# Patient Record
Sex: Female | Born: 2005 | Marital: Single
Health system: Southern US, Community
[De-identification: ages and names within clinical notes are randomized; demographics above are authoritative.]

---
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
## 2024-01-10 ENCOUNTER — Ambulatory Visit: Payer: MEDICAID | Admitting: Clinical

## 2024-01-10 DIAGNOSIS — F419 Anxiety disorder, unspecified: Secondary | ICD-10-CM

## 2024-01-10 DIAGNOSIS — F840 Autistic disorder: Secondary | ICD-10-CM | POA: Diagnosis not present

## 2024-01-10 NOTE — Progress Notes (Signed)
 Time: 8:01 am-8:55 am Diagnosis: F84.0, F41.9 CPT: 09162E-04  Cristina Long was seen remotely using secure video conferencing. She was in her home in [place] , and the therapist was in her office at the time of the appointment. Client is aware of risks of telehealth and consented to a virtual visit. Cristina Long and her mother reported upon two instances of being flexible, one in which Cristina Long had chosen a different routine, and another in which a plan had to be  changed due to circumstances beyond her control. Therapist discussed these situations with her, and engaged her in review of coping strategies, followed by review and discussion of social skills. Cristina Long is scheduled to be seen again in two weeks.   Treatment Plan Client Abilities/Strengths  Cristina Long presents as sociable and upbeat once she is engaged with an activity.  Client Treatment Preferences  Cristina Long is most engaged during morning appointments.  Client Statement of Needs  Cristina Long's mother is seeking CBT therapy to help her manage feelings of anxiety, especially related to changes in routine and time, and develop emotion regulation strategies  Treatment Level  Monthly  Symptoms  Anxiety: resistance to transition, feelings of worry, difficulty relaxing, tearfulness, angry outbursts including shouting and refusal to participate in undesired activities (Status: maintained).  Problems Addressed  New Description  Goals 1. Cristina Long's mother is seeking CBT therapy to help her manage anxiety and develop emotion regulation strategies Objective Cristina Long will be provided opportunities to process her experiences in social interactions and look for opportunities for social connection Target Date: 2026-011-1 Frequency: Biweekly  Progress: 60% Modality: individual  Related Interventions Therapist will provide Markeesha an opportunity to reflect upon her experiences in social interactions Therapist will engage Shontavia in review and discussion of social skills Therapist  will engage Cristina Long in discussion of communication strategies for when she is feeling stressed or would like to leave a social situation  Objective Cristina Long will develop strategies to regulate her emotions and will experience an overall decrease in anxiety Target Date: 2024-07-08 Frequency: Monthly  Progress: 70% Modality: individual   Objective Cristina Long will increase her time management skills Target date: 07/08/2024 Progress: 55% Modality: Individual Therapy Frequency: Biweekly  Objective  Cristina Long increase overall self awareness Target: 07/08/2024 Progress: 55% Modality: Individual Therapy Frequency: Biweekly   Related Interventions Therapist will engage Cristina Long's parents in treatment to help them create a routine and home environment that supports her goals Therapist will help Cristina Long to identify and disengage from maladaptive thought patterns using CBT-based strategies Therapist will provide referrals to additional resources as appropriate Therapist will incorporate video modeling to promote social skills as appropriate, including use of the [employer] and other resources Therapist will explore with Cristina Long how she can connect with others around her interests, as well as learn about the interests of others Cristina Long will develop strategies to help her regulate her emotions, including breathing exercises and self-care Therapist will provide Cristina Long with an opportunity to process her experiences in session Diagnosis Axis none 299.00 (Autistic disorder, current or active state) - Open - [Signifier: n/a]    Axis none 300.00 (Anxiety state, unspecified) - Open - [Signifier: n/a]    Conditions For Discharge Achievement of treatment goals and objectives   Intake Presenting Problem Cristina Long and her mother were seen remotely using the [employer] system. They are seeking CBT to help Cristina Long manage emotion regulation difficulties and anxiety related to a series of anticipated  transitions. Symptoms social impairment, self injury, rigid adherence to schedule and routine,  anxiety at unexpected changes History of Problem  Peter was diagnosed with ASD by the present provider in January of 2020 (please see evaluation on record). Since that time, her family has been able to access a range of services that have been helpful for her, including intensive in-home ABA therapy. However, Cristina Long has struggled with the abrupt end to some of her services, resulting in the end of relationships with beloved therapists  Recent Trigger  Cristina Long's mother is seeking CBT to help her manage anxiety and emotion regulation struggles related to anticipated transitions in therapy.  Marital and Family Information  Present family concerns/problems: none noted.  Strengths/resources in the family/friends: Cristina Long's mother presented as supportive of her development.  Marital/sexual history patterns: none reported.  Family of Origin  Problems in family of origin: None reported.  Family background / ethnic factors: None reported.  No needs/concerns related to ethnicity reported when asked: No  Education/Vocation  Interpersonal concerns/problems: Cristina Long was observed to struggle with social interactions, and appeared to function at about a 78 or 18 year-old level. This was consistent with results from an IQ test administered as part of her 2019 evaluation. She continues to struggle with difficulty with transition and rigidity regarding time.  Personal strengths: Cristina Long presented as pleasant and sociable Military/work problems/concerns: None noted.  Leisure Activities/Daily Functioning  impaired functioning  Legal Status  No Legal Problems  Medical/Nutritional Concerns  unspecified  Comments: Cristina Long has a history of health problems documented in her 2019 evaluation.  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  Motor Activity: tic, mannerism  Stream of Thought - Productivity: spontaneous  Stream of thought - Progression: perseveration  Stream of thought - Language: repetitive  Emotional tone and reactions - Mood: labile  Emotional tone and reactions - Affect: juvenile  Mental trend/Content of thoughts - Perception: normal  Mental trend/Content of thoughts - Orientation: normal  Mental trend/Content of thoughts - Memory: normal  Mental trend/Content of thoughts - General knowledge: concrete  Insight: minimal  Judgment: poor  Mental Status Comment:WNL  Diagnostic Summary  F84.0 (autism spectrum disorder), F41.9 Anxiety Disorder NOS   Intake Presenting Problem Cristina Long and her mother were seen remotely using the [employer] system. They are seeking CBT to help Cristina Long manage emotion regulation difficulties and anxiety related to a series of anticipated transitions. Symptoms social impairment, self injury, rigid adherence to schedule and routine, anxiety at unexpected changes History of Problem  Cristina Long was diagnosed with ASD by the present provider in January of 2020 (please see evaluation on record). Since that time, her family has been able to access a range of services that have been helpful for her, including intensive in-home ABA therapy. Recent Trigger  Liset's mother is seeking CBT to help her manage anxiety and emotion regulation struggles related to anticipated transitions in therapy.  Marital and Family Information  Present family concerns/problems: none noted.  Strengths/resources in the family/friends: Clarinda's mother presented as supportive of her development.  Marital/sexual history patterns: none reported.  Family of Origin  Problems in family of origin: None reported.  Family background / ethnic factors: None reported.  No needs/concerns related to ethnicity  reported when asked: No  Education/Vocation  Interpersonal concerns/problems: Timothy continues to struggle with difficulty with transition and rigidity regarding time. She also struggles  with reciprocal conversation. Personal strengths: Jayleigh is typically pleasant and motivated in therapy.  Military/work problems/concerns: None noted.  Leisure Activities/Daily Functioning  impaired functioning  Legal Status  No Legal Problems  Medical/Nutritional Concerns  unspecified  Comments: Brighton has a history of health problems documented in her 2019 evaluation.  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  Motor Activity: tic, mannerism  Stream of Thought - Productivity: spontaneous  Stream of thought - Progression: perseveration  Stream of thought - Language: repetitive  Emotional tone and reactions - Mood: labile  Emotional tone and reactions - Affect: WNL Mental trend/Content of thoughts - Perception: normal  Mental trend/Content of thoughts - Orientation: normal  Mental trend/Content of thoughts - Memory: normal  Mental trend/Content of thoughts - General knowledge: concrete  Insight: consistent with developmental delays  Judgment: consistent with developmental delays     Yoshie Kosel L Archana Eckman, PhD               Loletha Bertini L Kasie Leccese, PhD

## 2024-01-24 ENCOUNTER — Ambulatory Visit (INDEPENDENT_AMBULATORY_CARE_PROVIDER_SITE_OTHER): Payer: MEDICAID | Admitting: Clinical

## 2024-01-24 DIAGNOSIS — F840 Autistic disorder: Secondary | ICD-10-CM

## 2024-01-24 DIAGNOSIS — F419 Anxiety disorder, unspecified: Secondary | ICD-10-CM | POA: Diagnosis not present

## 2024-01-24 NOTE — Progress Notes (Signed)
 Time: 8:01 am-8:55 am Diagnosis: F84.0, F41.9 CPT: 09162E-04  Cristina Long was seen remotely using secure video conferencing. She was in her home in [place] , and the therapist was in her office at the time of the appointment. Client is aware of risks of telehealth and consented to a virtual visit. Cristina Long's mother joined the beginning of session and expressed pride in Cristina Long's recent flexibility during a day that did not go to plan. Session included review and practice of belly breathing and progressive muscle relaxation, as well as strategies to recognize false friends. Cristina Long is scheduled to be seen again in two weeks.   Treatment Plan Client Abilities/Strengths  Cristina Long presents as sociable and upbeat once she is engaged with an activity.  Client Treatment Preferences  Cristina Long is most engaged during morning appointments.  Client Statement of Needs  Cristina Long's mother is seeking CBT therapy to help her manage feelings of anxiety, especially related to changes in routine and time, and develop emotion regulation strategies  Treatment Level  Monthly  Symptoms  Anxiety: resistance to transition, feelings of worry, difficulty relaxing, tearfulness, angry outbursts including shouting and refusal to participate in undesired activities (Status: maintained).  Problems Addressed  New Description  Goals 1. Cristina Long mother is seeking CBT therapy to help her manage anxiety and develop emotion regulation strategies Objective Cristina Long will be provided opportunities to process her experiences in social interactions and look for opportunities for social connection Target Date: 2026-011-1 Frequency: Biweekly  Progress: 60% Modality: individual  Related Interventions Therapist will provide Stuti an opportunity to reflect upon her experiences in social interactions Therapist will engage Leatha in review and discussion of social skills Therapist will engage Cristina Long in discussion of communication strategies for when she is  feeling stressed or would like to leave a social situation  Objective Cristina Long will develop strategies to regulate her emotions and will experience an overall decrease in anxiety Target Date: 2024-07-08 Frequency: Monthly  Progress: 70% Modality: individual   Objective Cristina Long will increase her time management skills Target date: 07/08/2024 Progress: 55% Modality: Individual Therapy Frequency: Biweekly  Objective  Cristina Long increase overall self awareness Target: 07/08/2024 Progress: 55% Modality: Individual Therapy Frequency: Biweekly   Related Interventions Therapist will engage Cristina Long's parents in treatment to help them create a routine and home environment that supports her goals Therapist will help Cristina Long to identify and disengage from maladaptive thought patterns using CBT-based strategies Therapist will provide referrals to additional resources as appropriate Therapist will incorporate video modeling to promote social skills as appropriate, including use of the [employer] and other resources Therapist will explore with Cristina Long how she can connect with others around her interests, as well as learn about the interests of others Cristina Long will develop strategies to help her regulate her emotions, including breathing exercises and self-care Therapist will provide Cristina Long with an opportunity to process her experiences in session Diagnosis Axis none 299.00 (Autistic disorder, current or active state) - Open - [Signifier: n/a]    Axis none 300.00 (Anxiety state, unspecified) - Open - [Signifier: n/a]    Conditions For Discharge Achievement of treatment goals and objectives   Intake Presenting Problem Venora and her mother were seen remotely using the [employer] system. They are seeking CBT to help Cristina Long manage emotion regulation difficulties and anxiety related to a series of anticipated transitions. Symptoms social impairment, self injury, rigid adherence to schedule and  routine, anxiety at unexpected changes History of Problem  Cristina Long was diagnosed with ASD by the present  provider in January of 2020 (please see evaluation on record). Since that time, her family has been able to access a range of services that have been helpful for her, including intensive in-home ABA therapy. However, Cristina Long has struggled with the abrupt end to some of her services, resulting in the end of relationships with beloved therapists  Recent Trigger  Cristina Long mother is seeking CBT to help her manage anxiety and emotion regulation struggles related to anticipated transitions in therapy.  Marital and Family Information  Present family concerns/problems: none noted.  Strengths/resources in the family/friends: Cristina Long mother presented as supportive of her development.  Marital/sexual history patterns: none reported.  Family of Origin  Problems in family of origin: None reported.  Family background / ethnic factors: None reported.  No needs/concerns related to ethnicity reported when asked: No  Education/Vocation  Interpersonal concerns/problems: Cristina Long was observed to struggle with social interactions, and appeared to function at about a 33 or 18 year-old level. This was consistent with results from an IQ test administered as part of her 2019 evaluation. She continues to struggle with difficulty with transition and rigidity regarding time.  Personal strengths: Cristina Long presented as pleasant and sociable Military/work problems/concerns: None noted.  Leisure Activities/Daily Functioning  impaired functioning  Legal Status  No Legal Problems  Medical/Nutritional Concerns  unspecified  Comments: Cristina Long has a history of health problems documented in her 2019 evaluation.  Substance use/abuse/dependence  unspecified  Comments: Not reported  Religion/Spirituality  Not reported  Other  General Behavior: cooperative  Attire: appropriate  Gait: Raetta has a stiff gate and braces on her legs due to a  stroke she suffered in utero  Motor Activity: tic, mannerism  Stream of Thought - Productivity: spontaneous  Stream of thought - Progression: perseveration  Stream of thought - Language: repetitive  Emotional tone and reactions - Mood: labile  Emotional tone and reactions - Affect: juvenile  Mental trend/Content of thoughts - Perception: normal  Mental trend/Content of thoughts - Orientation: normal  Mental trend/Content of thoughts - Memory: normal  Mental trend/Content of thoughts - General knowledge: concrete  Insight: minimal  Judgment: poor  Mental Status Comment:WNL  Diagnostic Summary  F84.0 (autism spectrum disorder), F41.9 Anxiety Disorder NOS   Intake Presenting Problem Cristina Long and her mother were seen remotely using the [employer] system. They are seeking CBT to help Cristina Long manage emotion regulation difficulties and anxiety related to a series of anticipated transitions. Symptoms social impairment, self injury, rigid adherence to schedule and routine, anxiety at unexpected changes History of Problem  Cristina Long was diagnosed with ASD by the present provider in January of 2020 (please see evaluation on record). Since that time, her family has been able to access a range of services that have been helpful for her, including intensive in-home ABA therapy. Recent Trigger  Gissela's mother is seeking CBT to help her manage anxiety and emotion regulation struggles related to anticipated transitions in therapy.  Marital and Family Information  Present family concerns/problems: none noted.  Strengths/resources in the family/friends: Meaghan's mother presented as supportive of her development.  Marital/sexual history patterns: none reported.  Family of Origin  Problems in family of origin: None reported.  Family background / ethnic factors: None reported.  No needs/concerns related to ethnicity reported when asked: No  Education/Vocation  Interpersonal concerns/problems: Elfa continues to  struggle with difficulty with transition and rigidity regarding time. She also struggles with reciprocal conversation. Personal strengths: Rehmat is typically pleasant and motivated in therapy.  Military/work problems/concerns:  None noted.  Leisure Activities/Daily Functioning  impaired functioning  Legal Status  No Legal Problems  Medical/Nutritional Concerns  unspecified  Comments: Crosby has a history of health problems documented in her 2019 evaluation.  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  Motor Activity: tic, mannerism  Stream of Thought - Productivity: spontaneous  Stream of thought - Progression: perseveration  Stream of thought - Language: repetitive  Emotional tone and reactions - Mood: labile  Emotional tone and reactions - Affect: WNL Mental trend/Content of thoughts - Perception: normal  Mental trend/Content of thoughts - Orientation: normal  Mental trend/Content of thoughts - Memory: normal  Mental trend/Content of thoughts - General knowledge: concrete  Insight: consistent with developmental delays  Judgment: consistent with developmental delays       Aleathea Pugmire L Ike Maragh, PhD               Jazmarie Biever L Jasean Ambrosia, PhD

## 2024-02-07 ENCOUNTER — Ambulatory Visit (INDEPENDENT_AMBULATORY_CARE_PROVIDER_SITE_OTHER): Payer: MEDICAID | Admitting: Clinical

## 2024-02-07 DIAGNOSIS — F840 Autistic disorder: Secondary | ICD-10-CM

## 2024-02-07 DIAGNOSIS — F419 Anxiety disorder, unspecified: Secondary | ICD-10-CM

## 2024-02-07 NOTE — Progress Notes (Signed)
 Time: 8:01 am-8:54 am Diagnosis: F84.0, F41.9 CPT: 09162E-04  Cristina Long was seen remotely using secure video conferencing. She was in her home in [place] , and the therapist was in her office at the time of the appointment. Cristina Long is aware of risks of telehealth and consented to a virtual visit. Cristina Long's mother joined the beginning of session, and queried whether the therapist thinks medication may be worth exploring. Therapist indicated that it could be, and may be useful to have a medication Cristina Long can take as-needed on especially stressful days. Cristina Long reportedly has had a more stressful few weeks, characterized by a variety of out-of-routine experiences. Session included review and practice of a belly breathing exercise, as well as discussion of social skills, incorporating video modeling. Cristina Long is scheduled to be seen again in two weeks.   Treatment Plan Cristina Long Abilities/Strengths  Cristina Long presents as sociable and upbeat once she is engaged with an activity.  Cristina Long Treatment Preferences  Cristina Long is most engaged during morning appointments.  Cristina Long Statement of Needs  Cristina Long's mother is seeking CBT therapy to help her manage feelings of anxiety, especially related to changes in routine and time, and develop emotion regulation strategies  Treatment Level  Monthly  Symptoms  Anxiety: resistance to transition, feelings of worry, difficulty relaxing, tearfulness, angry outbursts including shouting and refusal to participate in undesired activities (Status: maintained).  Problems Addressed  New Description  Goals 1. Cristina Long's mother is seeking CBT therapy to help her manage anxiety and develop emotion regulation strategies Objective Cristina Long will be provided opportunities to process her experiences in social interactions and look for opportunities for social connection Target Date: 2026-011-1 Frequency: Biweekly  Progress: 60% Modality: individual  Related Interventions Therapist will provide Anjelique an  opportunity to reflect upon her experiences in social interactions Therapist will engage Karlynn in review and discussion of social skills Therapist will engage Bricia in discussion of communication strategies for when she is feeling stressed or would like to leave a social situation  Objective Cristina Long will develop strategies to regulate her emotions and will experience an overall decrease in anxiety Target Date: 2024-07-08 Frequency: Monthly  Progress: 70% Modality: individual   Objective Cristina Long will increase her time management skills Target date: 07/08/2024 Progress: 55% Modality: Individual Therapy Frequency: Biweekly  Objective  Cristina Long increase overall self awareness Target: 07/08/2024 Progress: 55% Modality: Individual Therapy Frequency: Biweekly   Related Interventions Therapist will engage Cristina Long's parents in treatment to help them create a routine and home environment that supports her goals Therapist will help Cristina Long to identify and disengage from maladaptive thought patterns using CBT-based strategies Therapist will provide referrals to additional resources as appropriate Therapist will incorporate video modeling to promote social skills as appropriate, including use of the [employer] and other resources Therapist will explore with Nashalie how she can connect with others around her interests, as well as learn about the interests of others Cristina Long will develop strategies to help her regulate her emotions, including breathing exercises and self-care Therapist will provide Cristina Long with an opportunity to process her experiences in session Diagnosis Axis none 299.00 (Autistic disorder, current or active state) - Open - [Signifier: n/a]    Axis none 300.00 (Anxiety state, unspecified) - Open - [Signifier: n/a]    Conditions For Discharge Achievement of treatment goals and objectives   Intake Presenting Problem Cristina Long and her mother were seen remotely using the [employer]  system. They are seeking CBT to help Cristina Long manage emotion regulation difficulties and anxiety related  to a series of anticipated transitions. Symptoms social impairment, self injury, rigid adherence to schedule and routine, anxiety at unexpected changes History of Problem  Ashonte was diagnosed with ASD by the present provider in January of 2020 (please see evaluation on record). Since that time, her family has been able to access a range of services that have been helpful for her, including intensive in-home ABA therapy. However, Cristina Long has struggled with the abrupt end to some of her services, resulting in the end of relationships with beloved therapists  Recent Trigger  Cristina Long's mother is seeking CBT to help her manage anxiety and emotion regulation struggles related to anticipated transitions in therapy.  Marital and Family Information  Present family concerns/problems: none noted.  Strengths/resources in the family/friends: Cristina Long's mother presented as supportive of her development.  Marital/sexual history patterns: none reported.  Family of Origin  Problems in family of origin: None reported.  Family background / ethnic factors: None reported.  No needs/concerns related to ethnicity reported when asked: No  Education/Vocation  Interpersonal concerns/problems: Cristina Long was observed to struggle with social interactions, and appeared to function at about a 57 or 18 year-old level. This was consistent with results from an IQ test administered as part of her 2019 evaluation. She continues to struggle with difficulty with transition and rigidity regarding time.  Personal strengths: Cristina Long presented as pleasant and sociable Military/work problems/concerns: None noted.  Leisure Activities/Daily Functioning  impaired functioning  Legal Status  No Legal Problems  Medical/Nutritional Concerns  unspecified  Comments: Cristina Long has a history of health problems documented in her 2019 evaluation.  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  Motor Activity: tic, mannerism  Stream of Thought - Productivity: spontaneous  Stream of thought - Progression: perseveration  Stream of thought - Language: repetitive  Emotional tone and reactions - Mood: labile  Emotional tone and reactions - Affect: juvenile  Mental trend/Content of thoughts - Perception: normal  Mental trend/Content of thoughts - Orientation: normal  Mental trend/Content of thoughts - Memory: normal  Mental trend/Content of thoughts - General knowledge: concrete  Insight: minimal  Judgment: poor  Mental Status Comment:WNL  Diagnostic Summary  F84.0 (autism spectrum disorder), F41.9 Anxiety Disorder NOS   Intake Presenting Problem Gurleen and her mother were seen remotely using the [employer] system. They are seeking CBT to help Shakeila manage emotion regulation difficulties and anxiety related to a series of anticipated transitions. Symptoms social impairment, self injury, rigid adherence to schedule and routine, anxiety at unexpected changes History of Problem  Ninetta was diagnosed with ASD by the present provider in January of 2020 (please see evaluation on record). Since that time, her family has been able to access a range of services that have been helpful for her, including intensive in-home ABA therapy. Recent Trigger  Joeleen's mother is seeking CBT to help her manage anxiety and emotion regulation struggles related to anticipated transitions in therapy.  Marital and Family Information  Present family concerns/problems: none noted.  Strengths/resources in the family/friends: Lee-Anne's mother presented as supportive of her development.  Marital/sexual history patterns: none reported.  Family of Origin  Problems in  family of origin: None reported.  Family background / ethnic factors: None reported.  No needs/concerns related to ethnicity reported when asked: No  Education/Vocation  Interpersonal concerns/problems: Corinthian continues to struggle with difficulty with transition and rigidity regarding time. She also struggles with reciprocal conversation. Personal strengths: Cristina Long is typically pleasant and motivated in therapy.  Military/work problems/concerns: None noted.  Leisure Activities/Daily Functioning  impaired functioning  Legal Status  No Legal Problems  Medical/Nutritional Concerns  unspecified  Comments: Addisyn has a history of health problems documented in her 2019 evaluation.  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  Motor Activity: tic, mannerism  Stream of Thought - Productivity: spontaneous  Stream of thought - Progression: perseveration  Stream of thought - Language: repetitive  Emotional tone and reactions - Mood: labile  Emotional tone and reactions - Affect: WNL Mental trend/Content of thoughts - Perception: normal  Mental trend/Content of thoughts - Orientation: normal  Mental trend/Content of thoughts - Memory: normal  Mental trend/Content of thoughts - General knowledge: concrete  Insight: consistent with developmental delays  Judgment: consistent with developmental delays    Naryah Clenney L Jozie Wulf, PhD               Lyan Moyano L Nakai Pollio, PhD

## 2024-02-12 ENCOUNTER — Encounter (INDEPENDENT_AMBULATORY_CARE_PROVIDER_SITE_OTHER): Payer: Self-pay

## 2024-02-21 ENCOUNTER — Ambulatory Visit (INDEPENDENT_AMBULATORY_CARE_PROVIDER_SITE_OTHER): Payer: MEDICAID | Admitting: Clinical

## 2024-02-21 DIAGNOSIS — F840 Autistic disorder: Secondary | ICD-10-CM

## 2024-02-21 DIAGNOSIS — F419 Anxiety disorder, unspecified: Secondary | ICD-10-CM

## 2024-02-21 NOTE — Progress Notes (Signed)
 Time: 8:01 am-8:55 am Diagnosis: F84.0, F41.9 CPT: 09162E-04  Cristina Long was seen remotely using secure video conferencing. She was in her home in [place] , and the therapist was in her office at the time of the appointment. Client is aware of risks of telehealth and consented to a virtual visit. Cristina Long's mother shared that she was struggling that morning due to an unexpected change in plan. Session began by discussing the change, with therapist pointing out ways in which Cristina Long had already been flexible, as well as engaging her in discussion and in vivo practice of coping strategies. Session included practice of a safe place visualization and shifting focus exercise, and concluded with discussion of the social skill of consideration of others. Cristina Long is scheduled to be seen again in two weeks.   Treatment Plan Client Abilities/Strengths  Cristina Long presents as sociable and upbeat once she is engaged with an activity.  Client Treatment Preferences  Cristina Long is most engaged during morning appointments.  Client Statement of Needs  Cristina Long's mother is seeking CBT therapy to help her manage feelings of anxiety, especially related to changes in routine and time, and develop emotion regulation strategies  Treatment Level  Monthly  Symptoms  Anxiety: resistance to transition, feelings of worry, difficulty relaxing, tearfulness, angry outbursts including shouting and refusal to participate in undesired activities (Status: maintained).  Problems Addressed  New Description  Goals 1. Chelle's mother is seeking CBT therapy to help her manage anxiety and develop emotion regulation strategies Objective Cristina Long will be provided opportunities to process her experiences in social interactions and look for opportunities for social connection Target Date: 2026-011-1 Frequency: Biweekly  Progress: 60% Modality: individual  Related Interventions Therapist will provide Amberli an opportunity to reflect upon her experiences in  social interactions Therapist will engage Jalana in review and discussion of social skills Therapist will engage Shante in discussion of communication strategies for when she is feeling stressed or would like to leave a social situation  Objective Cristina Long will develop strategies to regulate her emotions and will experience an overall decrease in anxiety Target Date: 2024-07-08 Frequency: Monthly  Progress: 70% Modality: individual   Objective Cristina Long will increase her time management skills Target date: 07/08/2024 Progress: 55% Modality: Individual Therapy Frequency: Biweekly  Objective  Cristina Long increase overall self awareness Target: 07/08/2024 Progress: 55% Modality: Individual Therapy Frequency: Biweekly   Related Interventions Therapist will engage Cristina Long's parents in treatment to help them create a routine and home environment that supports her goals Therapist will help Cristina Long to identify and disengage from maladaptive thought patterns using CBT-based strategies Therapist will provide referrals to additional resources as appropriate Therapist will incorporate video modeling to promote social skills as appropriate, including use of the [employer] and other resources Therapist will explore with Cristina Long how she can connect with others around her interests, as well as learn about the interests of others Cristina Long will develop strategies to help her regulate her emotions, including breathing exercises and self-care Therapist will provide Cristina Long with an opportunity to process her experiences in session Diagnosis Axis none 299.00 (Autistic disorder, current or active state) - Open - [Signifier: n/a]    Axis none 300.00 (Anxiety state, unspecified) - Open - [Signifier: n/a]    Conditions For Discharge Achievement of treatment goals and objectives   Intake Presenting Problem Cristina Long and her mother were seen remotely using the [employer] system. They are seeking CBT to help Maalle  manage emotion regulation difficulties and anxiety related to a series of  anticipated transitions. Symptoms social impairment, self injury, rigid adherence to schedule and routine, anxiety at unexpected changes History of Problem  Cristina Long was diagnosed with ASD by the present provider in January of 2020 (please see evaluation on record). Since that time, her family has been able to access a range of services that have been helpful for her, including intensive in-home ABA therapy. However, Nyara has struggled with the abrupt end to some of her services, resulting in the end of relationships with beloved therapists  Recent Trigger  Cristina Long's mother is seeking CBT to help her manage anxiety and emotion regulation struggles related to anticipated transitions in therapy.  Marital and Family Information  Present family concerns/problems: none noted.  Strengths/resources in the family/friends: Monya's mother presented as supportive of her development.  Marital/sexual history patterns: none reported.  Family of Origin  Problems in family of origin: None reported.  Family background / ethnic factors: None reported.  No needs/concerns related to ethnicity reported when asked: No  Education/Vocation  Interpersonal concerns/problems: Cristina Long was observed to struggle with social interactions, and appeared to function at about a 26 or 18 year-old level. This was consistent with results from an IQ test administered as part of her 2019 evaluation. She continues to struggle with difficulty with transition and rigidity regarding time.  Personal strengths: Cristina Long presented as pleasant and sociable Military/work problems/concerns: None noted.  Leisure Activities/Daily Functioning  impaired functioning  Legal Status  No Legal Problems  Medical/Nutritional Concerns  unspecified  Comments: Cristina Long has a history of health problems documented in her 2019 evaluation.  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  Motor Activity: tic, mannerism  Stream of Thought - Productivity: spontaneous  Stream of thought - Progression: perseveration  Stream of thought - Language: repetitive  Emotional tone and reactions - Mood: labile  Emotional tone and reactions - Affect: juvenile  Mental trend/Content of thoughts - Perception: normal  Mental trend/Content of thoughts - Orientation: normal  Mental trend/Content of thoughts - Memory: normal  Mental trend/Content of thoughts - General knowledge: concrete  Insight: minimal  Judgment: poor  Mental Status Comment:WNL  Diagnostic Summary  F84.0 (autism spectrum disorder), F41.9 Anxiety Disorder NOS   Intake Presenting Problem Cristina Long and her mother were seen remotely using the [employer] system. They are seeking CBT to help Cristina Long manage emotion regulation difficulties and anxiety related to a series of anticipated transitions. Symptoms social impairment, self injury, rigid adherence to schedule and routine, anxiety at unexpected changes History of Problem  Cristina Long was diagnosed with ASD by the present provider in January of 2020 (please see evaluation on record). Since that time, her family has been able to access a range of services that have been helpful for her, including intensive in-home ABA therapy. Recent Trigger  Cristina Long's mother is seeking CBT to help her manage anxiety and emotion regulation struggles related to anticipated transitions in therapy.  Marital and Family Information  Present family concerns/problems: none noted.  Strengths/resources in the family/friends: Cristina Long's mother presented as supportive of her development.  Marital/sexual history patterns: none reported.  Family of Origin  Problems in family of origin: None reported.  Family  background / ethnic factors: None reported.  No needs/concerns related to ethnicity reported when asked: No  Education/Vocation  Interpersonal concerns/problems: Cristina Long continues  to struggle with difficulty with transition and rigidity regarding time. She also struggles with reciprocal conversation. Personal strengths: Cristina Long is typically pleasant and motivated in therapy.  Military/work problems/concerns: None noted.  Leisure Activities/Daily Functioning  impaired functioning  Legal Status  No Legal Problems  Medical/Nutritional Concerns  unspecified  Comments: Betsaida has a history of health problems documented in her 2019 evaluation.  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  Motor Activity: tic, mannerism  Stream of Thought - Productivity: spontaneous  Stream of thought - Progression: perseveration  Stream of thought - Language: repetitive  Emotional tone and reactions - Mood: labile  Emotional tone and reactions - Affect: WNL Mental trend/Content of thoughts - Perception: normal  Mental trend/Content of thoughts - Orientation: normal  Mental trend/Content of thoughts - Memory: normal  Mental trend/Content of thoughts - General knowledge: concrete  Insight: consistent with developmental delays  Judgment: consistent with developmental delays    Zarius Furr L Aloysuis Ribaudo, PhD               Gentry Seeber L Zuleica Seith, PhD

## 2024-03-05 ENCOUNTER — Other Ambulatory Visit: Payer: Self-pay

## 2024-03-05 ENCOUNTER — Encounter (HOSPITAL_COMMUNITY): Payer: Self-pay

## 2024-03-05 ENCOUNTER — Ambulatory Visit (HOSPITAL_COMMUNITY): Payer: MEDICAID | Attending: Pediatrics

## 2024-03-05 DIAGNOSIS — R262 Difficulty in walking, not elsewhere classified: Secondary | ICD-10-CM | POA: Insufficient documentation

## 2024-03-05 DIAGNOSIS — R279 Unspecified lack of coordination: Secondary | ICD-10-CM | POA: Insufficient documentation

## 2024-03-05 DIAGNOSIS — G802 Spastic hemiplegic cerebral palsy: Secondary | ICD-10-CM | POA: Insufficient documentation

## 2024-03-05 NOTE — Therapy (Signed)
 OUTPATIENT PHYSICAL THERAPY NEURO EVALUATION   Patient Name: Cristina Long MRN: [id number] DOB:10-20-2005, 18 y.o., female Today's Date: 03/05/2024    END OF SESSION:  PT End of Session - 03/05/24 0933     Visit Number 1    Date for PT Re-Evaluation 04/02/24    Authorization Type [employer]    Authorization Time Period seeing auth    Progress Note Due on Visit 5    PT Start Time 0845    PT Stop Time 0923    PT Time Calculation (min) 38 min    Activity Tolerance Patient tolerated treatment well    Behavior During Therapy Agitated;Anxious          History reviewed. No pertinent past medical history. History reviewed. No pertinent surgical history. There are no active problems to display for this patient.  PCP: [employer]  REFERRING PROVIDER: Madalyn Nest, MD ONSET DATE: 3 years ago  REFERRING DIAG: h/o perinatal CVA  THERAPY DIAG:  Difficulty in walking, not elsewhere classified  Spastic hemiplegic cerebral palsy (HCC)  Unspecified lack of coordination  Rationale for Evaluation and Treatment: Rehabilitation  SUBJECTIVE:                                                                                                                                                                                             SUBJECTIVE STATEMENT: Pt is accompanied with mom. Pt thrives better to treat therapy as a social thing and then doing some work. Pt states she worked here with a therapist here for about 6 months. Pts mom would like to see if AFO brace is fitting well, reports it is about 75 years old. Pts mom states she is pretty fierce and can be defiant for some therapy. Pts mom states pt will say her feet hurt. Pt has difficulty walking on uneven terrain at home. Pt gets distracted easy and has a seizure disorder and had one this morning. Pts mom states that outpatient therapy is stressful for all parties involved and asks about at home therapy  options.  Pt accompanied by: family member  PERTINENT HISTORY:  Seizure disorder Autism  PAIN:  Are you having pain? Yes: NPRS scale: 5/10 (pts facial expression) Pain location: LLE Pain description: unable Aggravating factors: walking Relieving factors: rest  PRECAUTIONS: None  RED FLAGS: None   WEIGHT BEARING RESTRICTIONS: No  FALLS: Has patient fallen in last 6 months? No  LIVING ENVIRONMENT: Lives with: lives with their family Lives in: House/apartment Stairs: No Has following equipment at home: AFO braces  PLOF: Requires assistive device for independence and Needs assistance with  ADLs  PATIENT GOALS: to walk better with less sway, walking at home on inclines and declines better  OBJECTIVE:  Note: Objective measures were completed at Evaluation unless otherwise noted.  DIAGNOSTIC FINDINGS:  CLINICAL DATA:  Hydrocephalus, unspecified type.   EXAM: CT HEAD WITHOUT CONTRAST   TECHNIQUE: Contiguous axial images were obtained from the base of the skull through the vertex without intravenous contrast.   COMPARISON:  Prior head CT 04/08/2011.   FINDINGS: Brain:   Mildly motion degraded exam.   A right parietal approach ventricular catheter terminates within the posterior body of the right lateral ventricle, abutting the medial margin. Direct comparison of size of the ventricles is limited as the most recent available prior head CT was acquired 04/08/2011, at which time the patient was 18 years old. As before, there is dilation of the entirety of the right lateral ventricle. Previously demonstrated dilation of the left frontal horn has decreased. Dilation of the left lateral ventricle body and atrium is similar to the prior exam, as is mild dilation of the third ventricle.   Redemonstrated dysmorphic appearance of the brain. Similar to the prior exam, there is parenchymal volume loss and suspected encephalomalacia/gliosis surrounding the right lateral  ventricle frontal horn.   Redemonstrated additional small remote insults within the periventricular right frontal lobe (for instance as seen on series 2, image 17).   Unchanged rightward bowing of the septum pellucidum.   There is no acute intracranial hemorrhage.   No acute demarcated cortical infarct.   No extra-axial fluid collection.   No evidence of intracranial mass.   Vascular: No hyperdense vessel.   Skull: No calvarial fracture or focal suspicious osseous lesion.   Sinuses/Orbits: Visualized orbits show no acute finding. No significant paranasal sinus disease at the imaged levels.   IMPRESSION: Mildly motion degraded exam.   A right parietal approach ventricular catheter terminates within the body of the right lateral ventricle.   Direct comparison of ventricular size is limited as the most recent available prior head CT was acquired 06/08/2011 (at which time the patient was 18 years old). However, with the exception of less prominent dilation of the left lateral ventricle frontal horn, the ventricular configuration has not significantly changed. As before, there is lateral and third ventriculomegaly with the right lateral ventricle most notably dilated.   Redemonstrated dysmorphic appearance of the brain. Similar to the prior exam, there is parenchymal volume loss and suspected encephalomalacia/gliosis surrounding the right lateral ventricle frontal horn.   Redemonstrated additional small remote insults within the periventricular right frontal lobe.  COGNITION: Overall cognitive status: Impaired, mother assists with subjective, pt not agreeable to participate at some points throughout session     COORDINATION: Deficits apparent with LLE and LUE  POSTURE: rounded shoulders and flexed trunk   LOWER EXTREMITY ROM:     Active  Right Eval Left Eval  Hip flexion    Hip extension    Hip abduction    Hip adduction    Hip internal rotation    Hip  external rotation    Knee flexion 110 105  Knee extension 0 0  Ankle dorsiflexion    Ankle plantarflexion    Ankle inversion    Ankle eversion     (Blank rows = not tested)  LOWER EXTREMITY MMT:    MMT Right Eval Left Eval  Hip flexion    Hip extension    Hip abduction    Hip adduction    Hip internal rotation  Hip external rotation    Knee flexion 4 4  Knee extension 3+ 3-  Ankle dorsiflexion    Ankle plantarflexion    Ankle inversion    Ankle eversion    (Blank rows = not tested)  GAIT: Findings: Gait Characteristics: decreased arm swing- Left, decreased step length- Left, Left foot flat, ataxic, decreased trunk rotation, and trunk flexed, Distance walked: 225 feet, and Comments: AFO donned on LLE, pt requires verbal cueing for clinic navigation and redirection cues  FUNCTIONAL TESTS:  5 times sit to stand: 14.55 seconds 2 minute walk test: 225 feet, AFO on LLE                                                                                                                               TREATMENT DATE:  03/05/2024   Evaluation: -ROM measured, Strength assessed, HEP prescribed, pt educated on prognosis, findings, and importance of HEP compliance if given.  PATIENT EDUCATION: Education details: Pt was educated on findings of PT evaluation, prognosis, frequency of therapy visits and rationale, attendance policy, and HEP if given.   Person educated: Patient and Parent Education method: Explanation, Verbal cues, and Handouts Education comprehension: verbal cues required, tactile cues required, and needs further education  HOME EXERCISE PROGRAM: Access Code: [email or web address] URL: [email or web address] Date: 03/05/2024 Prepared by: Lang Ada  Exercises - Seated Scapular Retraction  - 1 x daily - 7 x weekly - 3 sets - 10 reps - Supine Bridge  - 1 x daily - 7 x weekly - 3 sets - 10 reps - Sit to Stand with Arms Crossed  - 1 x daily - 7 x weekly - 3 sets - 10  reps  GOALS: No goals set due to pt wanting to explore other at home therapy options at this time.    ASSESSMENT:  CLINICAL IMPRESSION: Patient is a 18 y.o. female who was seen today for physical therapy evaluation and treatment for h/o perinatal CVA.   Patient demonstrates decreased LE strength, LLE ROM, abnormal pain rating seems to be caused by pressure from AFO, and impaired balance due to LLE. Patient also demonstrates difficulty with ambulation during today's session with decreased stride length and velocity noted especially due to L sided hemiplegia. Patient also demonstrates abnormal posturing of UE with hemiplegia present. Patient and pts mother requires education on reaching out to orthotist for new AFO as pt has evidence of some pressure type injuries on LLE. Patient would benefit from skilled physical therapy for increased endurance with ambulation, increased LE strength, and balance for improved gait quality, return to higher level of function with ADLs, and progress towards therapy goals. Upon further communication with pts mother, they have decided to pursue a mobile outpatient form of therapy at this time and would not be returning to our outpatient clinic.    OBJECTIVE IMPAIRMENTS: Abnormal gait, decreased activity tolerance, decreased balance, decreased coordination, decreased endurance, decreased mobility, difficulty walking, decreased ROM, decreased strength,  improper body mechanics, postural dysfunction, and pain.   ACTIVITY LIMITATIONS: carrying, lifting, bending, sitting, standing, squatting, stairs, transfers, and bed mobility  PARTICIPATION LIMITATIONS: meal prep, cleaning, laundry, and community activity  PERSONAL FACTORS: Age, Fitness, Past/current experiences, Time since onset of injury/illness/exacerbation, and 1 comorbidity: IDD, seizure disorder are also affecting patient's functional outcome.   REHAB POTENTIAL: Fair personal factors  CLINICAL DECISION MAKING:  Stable/uncomplicated  EVALUATION COMPLEXITY: Low  PLAN:  PT FREQUENCY: one time visit  PT DURATION: one time  PLANNED INTERVENTIONS: 97110-Therapeutic exercises, 97530- Therapeutic activity, [id number]- Neuromuscular re-education, [phone]- Self Care, 02859- Manual therapy, [phone]- Gait training, [phone]- Orthotic/Prosthetic subsequent, Patient/Family education, Balance training, Stair training, Joint mobilization, DME instructions, Cryotherapy, and Moist heat  PLAN FOR NEXT SESSION: one time visit   Lang Ada, PT, DPT [facility] Office: [phone] 1:23 PM, 03/05/24

## 2024-03-06 ENCOUNTER — Ambulatory Visit: Payer: MEDICAID | Admitting: Clinical

## 2024-03-06 DIAGNOSIS — F419 Anxiety disorder, unspecified: Secondary | ICD-10-CM | POA: Diagnosis not present

## 2024-03-06 DIAGNOSIS — F840 Autistic disorder: Secondary | ICD-10-CM | POA: Diagnosis not present

## 2024-03-06 NOTE — Progress Notes (Signed)
 Time: 8:01 am-8:54 am Diagnosis: F84.0, F41.9 CPT: 09162E-04  Smrithi was seen remotely using secure video conferencing. She was in her home in [place] , and the therapist was in her office at the time of the appointment. Client is aware of risks of telehealth and consented to a virtual visit. At [facility] request, her mother joined session to share about instances of Abraham being flexible. Jaskiran's mother speculated whether her meltdowns may sometimes serve an avoidance function by taking up time so that activities Yeny does not want to do are canceled. Therapist encouraged her to continue to explore this hypothesis. Session included review and practice of coping strategies and discussion of the social skill of identifying false friends, incorporating video modeling. Tonjua is scheduled to be seen again in two weeks.   Treatment Plan Client Abilities/Strengths  Milicent presents as sociable and upbeat once she is engaged with an activity.  Client Treatment Preferences  Caelen is most engaged during morning appointments.  Client Statement of Needs  Prudy's mother is seeking CBT therapy to help her manage feelings of anxiety, especially related to changes in routine and time, and develop emotion regulation strategies  Treatment Level  Monthly  Symptoms  Anxiety: resistance to transition, feelings of worry, difficulty relaxing, tearfulness, angry outbursts including shouting and refusal to participate in undesired activities (Status: maintained).  Problems Addressed  New Description  Goals 1. Somya's mother is seeking CBT therapy to help her manage anxiety and develop emotion regulation strategies Objective Saniya will be provided opportunities to process her experiences in social interactions and look for opportunities for social connection Target Date: 2026-011-1 Frequency: Biweekly  Progress: 60% Modality: individual  Related Interventions Therapist will provide Dariane an opportunity to reflect  upon her experiences in social interactions Therapist will engage Adrain in review and discussion of social skills Therapist will engage Jonia in discussion of communication strategies for when she is feeling stressed or would like to leave a social situation  Objective Magdeline will develop strategies to regulate her emotions and will experience an overall decrease in anxiety Target Date: 2024-07-08 Frequency: Monthly  Progress: 70% Modality: individual   Objective Shadoe will increase her time management skills Target date: 07/08/2024 Progress: 55% Modality: Individual Therapy Frequency: Biweekly  Objective  Analei increase overall self awareness Target: 07/08/2024 Progress: 55% Modality: Individual Therapy Frequency: Biweekly   Related Interventions Therapist will engage Tyriana's parents in treatment to help them create a routine and home environment that supports her goals Therapist will help Anjolaoluwa to identify and disengage from maladaptive thought patterns using CBT-based strategies Therapist will provide referrals to additional resources as appropriate Therapist will incorporate video modeling to promote social skills as appropriate, including use of the [employer] and other resources Therapist will explore with Rhylin how she can connect with others around her interests, as well as learn about the interests of others Jermika will develop strategies to help her regulate her emotions, including breathing exercises and self-care Therapist will provide Mayci with an opportunity to process her experiences in session Diagnosis Axis none 299.00 (Autistic disorder, current or active state) - Open - [Signifier: n/a]    Axis none 300.00 (Anxiety state, unspecified) - Open - [Signifier: n/a]    Conditions For Discharge Achievement of treatment goals and objectives   Intake Presenting Problem Isla and her mother were seen remotely using the [employer] system. They are  seeking CBT to help Konstance manage emotion regulation difficulties and anxiety related to a series of  anticipated transitions. Symptoms social impairment, self injury, rigid adherence to schedule and routine, anxiety at unexpected changes History of Problem  Shayanna was diagnosed with ASD by the present provider in January of 2020 (please see evaluation on record). Since that time, her family has been able to access a range of services that have been helpful for her, including intensive in-home ABA therapy. However, Romey has struggled with the abrupt end to some of her services, resulting in the end of relationships with beloved therapists  Recent Trigger  Stephanieann's mother is seeking CBT to help her manage anxiety and emotion regulation struggles related to anticipated transitions in therapy.  Marital and Family Information  Present family concerns/problems: none noted.  Strengths/resources in the family/friends: Sanskriti's mother presented as supportive of her development.  Marital/sexual history patterns: none reported.  Family of Origin  Problems in family of origin: None reported.  Family background / ethnic factors: None reported.  No needs/concerns related to ethnicity reported when asked: No  Education/Vocation  Interpersonal concerns/problems: Haiden was observed to struggle with social interactions, and appeared to function at about a 50 or 19 year-old level. This was consistent with results from an IQ test administered as part of her 2019 evaluation. She continues to struggle with difficulty with transition and rigidity regarding time.  Personal strengths: Pammy presented as pleasant and sociable Military/work problems/concerns: None noted.  Leisure Activities/Daily Functioning  impaired functioning  Legal Status  No Legal Problems  Medical/Nutritional Concerns  unspecified  Comments: Karan has a history of health problems documented in her 2019 evaluation.  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  Motor Activity: tic, mannerism  Stream of Thought - Productivity: spontaneous  Stream of thought - Progression: perseveration  Stream of thought - Language: repetitive  Emotional tone and reactions - Mood: labile  Emotional tone and reactions - Affect: juvenile  Mental trend/Content of thoughts - Perception: normal  Mental trend/Content of thoughts - Orientation: normal  Mental trend/Content of thoughts - Memory: normal  Mental trend/Content of thoughts - General knowledge: concrete  Insight: minimal  Judgment: poor  Mental Status Comment:WNL  Diagnostic Summary  F84.0 (autism spectrum disorder), F41.9 Anxiety Disorder NOS   Intake Presenting Problem Onalee and her mother were seen remotely using the [employer] system. They are seeking CBT to help Amauri manage emotion regulation difficulties and anxiety related to a series of anticipated transitions. Symptoms social impairment, self injury, rigid adherence to schedule and routine, anxiety at unexpected changes History of Problem  Delrae was diagnosed with ASD by the present provider in January of 2020 (please see evaluation on record). Since that time, her family has been able to access a range of services that have been helpful for her, including intensive in-home ABA therapy. Recent Trigger  Jo's mother is seeking CBT to help her manage anxiety and emotion regulation struggles related to anticipated transitions in therapy.  Marital and Family Information  Present family concerns/problems: none noted.  Strengths/resources in the family/friends: Lurdes's mother presented as supportive of her development.  Marital/sexual history patterns: none reported.  Family of Origin  Problems in family of origin: None  reported.  Family background / ethnic factors: None reported.  No needs/concerns related to ethnicity reported when asked: No  Education/Vocation  Interpersonal concerns/problems: Gail continues  to struggle with difficulty with transition and rigidity regarding time. She also struggles with reciprocal conversation. Personal strengths: Brianda is typically pleasant and motivated in therapy.  Military/work problems/concerns: None noted.  Leisure Activities/Daily Functioning  impaired functioning  Legal Status  No Legal Problems  Medical/Nutritional Concerns  unspecified  Comments: Contrina has a history of health problems documented in her 2019 evaluation.  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  Motor Activity: tic, mannerism  Stream of Thought - Productivity: spontaneous  Stream of thought - Progression: perseveration  Stream of thought - Language: repetitive  Emotional tone and reactions - Mood: labile  Emotional tone and reactions - Affect: WNL Mental trend/Content of thoughts - Perception: normal  Mental trend/Content of thoughts - Orientation: normal  Mental trend/Content of thoughts - Memory: normal  Mental trend/Content of thoughts - General knowledge: concrete  Insight: consistent with developmental delays  Judgment: consistent with developmental delays    Jayliana Valencia L Marcas Bowsher, PhD               Itza Maniaci L Maley Venezia, PhD

## 2024-03-20 ENCOUNTER — Ambulatory Visit: Payer: MEDICAID | Admitting: Clinical

## 2024-03-20 DIAGNOSIS — F840 Autistic disorder: Secondary | ICD-10-CM | POA: Diagnosis not present

## 2024-03-20 DIAGNOSIS — F419 Anxiety disorder, unspecified: Secondary | ICD-10-CM

## 2024-03-20 NOTE — Progress Notes (Signed)
 Time: 8:01 am-8:54 am Diagnosis: F84.0, F41.9 CPT: 09162E-04  Ellenora was seen remotely using secure video conferencing. She was in her home in [place] , and the therapist was in her office at the time of the appointment. Client is aware of risks of telehealth and consented to a virtual visit. Chamya's mother joined the beginning of session and reported that she has been doing well overall, and had demonstrated several instances of flexibility and going with the flow. Therapist met with Cameshia and discussed situations that had arisen. Therapist also engaged Lee-Ann in review and practice of coping strategies, as well as review of the social skill of asking for help. Arshiya is scheduled to be seen again in two weeks.   Treatment Plan Client Abilities/Strengths  Baelynn presents as sociable and upbeat once she is engaged with an activity.  Client Treatment Preferences  Roland is most engaged during morning appointments.  Client Statement of Needs  Dalexa's mother is seeking CBT therapy to help her manage feelings of anxiety, especially related to changes in routine and time, and develop emotion regulation strategies  Treatment Level  Monthly  Symptoms  Anxiety: resistance to transition, feelings of worry, difficulty relaxing, tearfulness, angry outbursts including shouting and refusal to participate in undesired activities (Status: maintained).  Problems Addressed  New Description  Goals 1. Noorah's mother is seeking CBT therapy to help her manage anxiety and develop emotion regulation strategies Objective Caylee will be provided opportunities to process her experiences in social interactions and look for opportunities for social connection Target Date: 2026-011-1 Frequency: Biweekly  Progress: 60% Modality: individual  Related Interventions Therapist will provide Farrell an opportunity to reflect upon her experiences in social interactions Therapist will engage Ronika in review and discussion of  social skills Therapist will engage Aubryana in discussion of communication strategies for when she is feeling stressed or would like to leave a social situation  Objective Ezrie will develop strategies to regulate her emotions and will experience an overall decrease in anxiety Target Date: 2024-07-08 Frequency: Monthly  Progress: 70% Modality: individual   Objective Shatora will increase her time management skills Target date: 07/08/2024 Progress: 55% Modality: Individual Therapy Frequency: Biweekly  Objective  Coralee increase overall self awareness Target: 07/08/2024 Progress: 55% Modality: Individual Therapy Frequency: Biweekly   Related Interventions Therapist will engage Jatoya's parents in treatment to help them create a routine and home environment that supports her goals Therapist will help Arabell to identify and disengage from maladaptive thought patterns using CBT-based strategies Therapist will provide referrals to additional resources as appropriate Therapist will incorporate video modeling to promote social skills as appropriate, including use of the [employer] and other resources Therapist will explore with Toy how she can connect with others around her interests, as well as learn about the interests of others Kehaulani will develop strategies to help her regulate her emotions, including breathing exercises and self-care Therapist will provide Uchechi with an opportunity to process her experiences in session Diagnosis Axis none 299.00 (Autistic disorder, current or active state) - Open - [Signifier: n/a]    Axis none 300.00 (Anxiety state, unspecified) - Open - [Signifier: n/a]    Conditions For Discharge Achievement of treatment goals and objectives   Intake Presenting Problem Rosmarie and her mother were seen remotely using the [employer] system. They are seeking CBT to help Francille manage emotion regulation difficulties and anxiety related to a series of  anticipated transitions. Symptoms social impairment, self injury, rigid adherence to schedule  and routine, anxiety at unexpected changes History of Problem  Crystalyn was diagnosed with ASD by the present provider in January of 2020 (please see evaluation on record). Since that time, her family has been able to access a range of services that have been helpful for her, including intensive in-home ABA therapy. However, Shontavia has struggled with the abrupt end to some of her services, resulting in the end of relationships with beloved therapists  Recent Trigger  Pam's mother is seeking CBT to help her manage anxiety and emotion regulation struggles related to anticipated transitions in therapy.  Marital and Family Information  Present family concerns/problems: none noted.  Strengths/resources in the family/friends: Fritzi's mother presented as supportive of her development.  Marital/sexual history patterns: none reported.  Family of Origin  Problems in family of origin: None reported.  Family background / ethnic factors: None reported.  No needs/concerns related to ethnicity reported when asked: No  Education/Vocation  Interpersonal concerns/problems: Monalisa was observed to struggle with social interactions, and appeared to function at about a 67 or 18 year-old level. This was consistent with results from an IQ test administered as part of her 2019 evaluation. She continues to struggle with difficulty with transition and rigidity regarding time.  Personal strengths: Kataleena presented as pleasant and sociable Military/work problems/concerns: None noted.  Leisure Activities/Daily Functioning  impaired functioning  Legal Status  No Legal Problems  Medical/Nutritional Concerns  unspecified  Comments: Curry has a history of health problems documented in her 2019 evaluation.  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  Motor Activity: tic, mannerism  Stream of Thought - Productivity: spontaneous  Stream of thought - Progression: perseveration  Stream of thought - Language: repetitive  Emotional tone and reactions - Mood: labile  Emotional tone and reactions - Affect: juvenile  Mental trend/Content of thoughts - Perception: normal  Mental trend/Content of thoughts - Orientation: normal  Mental trend/Content of thoughts - Memory: normal  Mental trend/Content of thoughts - General knowledge: concrete  Insight: minimal  Judgment: poor  Mental Status Comment:WNL  Diagnostic Summary  F84.0 (autism spectrum disorder), F41.9 Anxiety Disorder NOS   Intake Presenting Problem Nakesha and her mother were seen remotely using the [employer] system. They are seeking CBT to help Marieclaire manage emotion regulation difficulties and anxiety related to a series of anticipated transitions. Symptoms social impairment, self injury, rigid adherence to schedule and routine, anxiety at unexpected changes History of Problem  Matty was diagnosed with ASD by the present provider in January of 2020 (please see evaluation on record). Since that time, her family has been able to access a range of services that have been helpful for her, including intensive in-home ABA therapy. Recent Trigger  Jalena's mother is seeking CBT to help her manage anxiety and emotion regulation struggles related to anticipated transitions in therapy.  Marital and Family Information  Present family concerns/problems: none noted.  Strengths/resources in the family/friends: Dyana's mother presented as supportive of her development.  Marital/sexual history patterns: none reported.  Family of Origin  Problems in family of origin: None reported.  Family background / ethnic factors: None reported.  No needs/concerns related to  ethnicity reported when asked: No  Education/Vocation  Interpersonal concerns/problems: Rita continues to struggle with difficulty with transition and rigidity regarding time. She  also struggles with reciprocal conversation. Personal strengths: Tyne is typically pleasant and motivated in therapy.  Military/work problems/concerns: None noted.  Leisure Activities/Daily Functioning  impaired functioning  Legal Status  No Legal Problems  Medical/Nutritional Concerns  unspecified  Comments: Thanya has a history of health problems documented in her 2019 evaluation.  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  Motor Activity: tic, mannerism  Stream of Thought - Productivity: spontaneous  Stream of thought - Progression: perseveration  Stream of thought - Language: repetitive  Emotional tone and reactions - Mood: labile  Emotional tone and reactions - Affect: WNL Mental trend/Content of thoughts - Perception: normal  Mental trend/Content of thoughts - Orientation: normal  Mental trend/Content of thoughts - Memory: normal  Mental trend/Content of thoughts - General knowledge: concrete  Insight: consistent with developmental delays  Judgment: consistent with developmental delays    Judithe Keetch L Cameo Shewell, PhD               Ondre Salvetti L Dynasty Holquin, PhD

## 2024-03-26 ENCOUNTER — Encounter (HOSPITAL_COMMUNITY): Payer: MEDICAID

## 2024-04-02 ENCOUNTER — Encounter (HOSPITAL_COMMUNITY): Payer: MEDICAID

## 2024-04-03 ENCOUNTER — Ambulatory Visit: Payer: MEDICAID | Admitting: Clinical

## 2024-04-03 DIAGNOSIS — F419 Anxiety disorder, unspecified: Secondary | ICD-10-CM | POA: Diagnosis not present

## 2024-04-03 DIAGNOSIS — F840 Autistic disorder: Secondary | ICD-10-CM | POA: Diagnosis not present

## 2024-04-03 NOTE — Progress Notes (Signed)
 Time: 8:01 am-8:54 am Diagnosis: F84.0, F41.9 CPT: 09162E-04  Cristina Long was seen remotely using secure video conferencing. She was in her home in [place] , and the therapist was in her office at the time of the appointment. Client is aware of risks of telehealth and consented to a virtual visit. Amaiyah's mother joined the beginning of session and reported upon several stressful events for Mahalia in recent weeks, including the elevator breaking down at her uncle's house, and her mother going to the ER for a dislocated shoulder. Therapist then met with Deltha and processed these events with her, before engaging her in a relaxation exercise (pebble meditation), followed by video modeling of the social skills involved in making a phone call to someone she does not know. Kaitlan is scheduled to be seen again in two weeks.   Treatment Plan Client Abilities/Strengths  Cassandria presents as sociable and upbeat once she is engaged with an activity.  Client Treatment Preferences  Lakeisa is most engaged during morning appointments.  Client Statement of Needs  Elisabet's mother is seeking CBT therapy to help her manage feelings of anxiety, especially related to changes in routine and time, and develop emotion regulation strategies  Treatment Level  Monthly  Symptoms  Anxiety: resistance to transition, feelings of worry, difficulty relaxing, tearfulness, angry outbursts including shouting and refusal to participate in undesired activities (Status: maintained).  Problems Addressed  New Description  Goals 1. Mckenzie's mother is seeking CBT therapy to help her manage anxiety and develop emotion regulation strategies Objective Jenness will be provided opportunities to process her experiences in social interactions and look for opportunities for social connection Target Date: 2026-011-1 Frequency: Biweekly  Progress: 60% Modality: individual  Related Interventions Therapist will provide Makeyla an opportunity to reflect upon  her experiences in social interactions Therapist will engage Sanaia in review and discussion of social skills Therapist will engage Shandra in discussion of communication strategies for when she is feeling stressed or would like to leave a social situation  Objective Nashanti will develop strategies to regulate her emotions and will experience an overall decrease in anxiety Target Date: 2024-07-08 Frequency: Monthly  Progress: 70% Modality: individual   Objective Rhema will increase her time management skills Target date: 07/08/2024 Progress: 55% Modality: Individual Therapy Frequency: Biweekly  Objective  Teyah increase overall self awareness Target: 07/08/2024 Progress: 55% Modality: Individual Therapy Frequency: Biweekly   Related Interventions Therapist will engage Nakita's parents in treatment to help them create a routine and home environment that supports her goals Therapist will help Ronika to identify and disengage from maladaptive thought patterns using CBT-based strategies Therapist will provide referrals to additional resources as appropriate Therapist will incorporate video modeling to promote social skills as appropriate, including use of the [employer] and other resources Therapist will explore with Hester how she can connect with others around her interests, as well as learn about the interests of others Nelia will develop strategies to help her regulate her emotions, including breathing exercises and self-care Therapist will provide Kaida with an opportunity to process her experiences in session Diagnosis Axis none 299.00 (Autistic disorder, current or active state) - Open - [Signifier: n/a]    Axis none 300.00 (Anxiety state, unspecified) - Open - [Signifier: n/a]    Conditions For Discharge Achievement of treatment goals and objectives   Intake Presenting Problem Lyza and her mother were seen remotely using the [employer] system. They are seeking  CBT to help Eliannah manage emotion regulation difficulties and  anxiety related to a series of anticipated transitions. Symptoms social impairment, self injury, rigid adherence to schedule and routine, anxiety at unexpected changes History of Problem  Shaelee was diagnosed with ASD by the present provider in January of 2020 (please see evaluation on record). Since that time, her family has been able to access a range of services that have been helpful for her, including intensive in-home ABA therapy. However, Jylian has struggled with the abrupt end to some of her services, resulting in the end of relationships with beloved therapists  Recent Trigger  Kateria's mother is seeking CBT to help her manage anxiety and emotion regulation struggles related to anticipated transitions in therapy.  Marital and Family Information  Present family concerns/problems: none noted.  Strengths/resources in the family/friends: Khianna's mother presented as supportive of her development.  Marital/sexual history patterns: none reported.  Family of Origin  Problems in family of origin: None reported.  Family background / ethnic factors: None reported.  No needs/concerns related to ethnicity reported when asked: No  Education/Vocation  Interpersonal concerns/problems: Mikyah was observed to struggle with social interactions, and appeared to function at about a 52 or 18 year-old level. This was consistent with results from an IQ test administered as part of her 2019 evaluation. She continues to struggle with difficulty with transition and rigidity regarding time.  Personal strengths: Cayleigh presented as pleasant and sociable Military/work problems/concerns: None noted.  Leisure Activities/Daily Functioning  impaired functioning  Legal Status  No Legal Problems  Medical/Nutritional Concerns  unspecified  Comments: Sayaka has a history of health problems documented in her 2019 evaluation.  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  Motor Activity: tic, mannerism  Stream of Thought - Productivity: spontaneous  Stream of thought - Progression: perseveration  Stream of thought - Language: repetitive  Emotional tone and reactions - Mood: labile  Emotional tone and reactions - Affect: juvenile  Mental trend/Content of thoughts - Perception: normal  Mental trend/Content of thoughts - Orientation: normal  Mental trend/Content of thoughts - Memory: normal  Mental trend/Content of thoughts - General knowledge: concrete  Insight: minimal  Judgment: poor  Mental Status Comment:WNL  Diagnostic Summary  F84.0 (autism spectrum disorder), F41.9 Anxiety Disorder NOS   Intake Presenting Problem Anastazja and her mother were seen remotely using the [employer] system. They are seeking CBT to help Zabrina manage emotion regulation difficulties and anxiety related to a series of anticipated transitions. Symptoms social impairment, self injury, rigid adherence to schedule and routine, anxiety at unexpected changes History of Problem  Terricka was diagnosed with ASD by the present provider in January of 2020 (please see evaluation on record). Since that time, her family has been able to access a range of services that have been helpful for her, including intensive in-home ABA therapy. Recent Trigger  Sarahanne's mother is seeking CBT to help her manage anxiety and emotion regulation struggles related to anticipated transitions in therapy.  Marital and Family Information  Present family concerns/problems: none noted.  Strengths/resources in the family/friends: Julea's mother presented as supportive of her development.  Marital/sexual history patterns: none reported.  Family of Origin  Problems in family of origin: None  reported.  Family background / ethnic factors: None reported.  No needs/concerns related to ethnicity reported when asked: No  Education/Vocation  Interpersonal concerns/problems: Ja continues to struggle with difficulty with transition and rigidity regarding time. She also struggles with reciprocal conversation. Personal strengths: Tosha is typically pleasant and motivated in therapy.  Military/work problems/concerns: None noted.  Leisure Activities/Daily Functioning  impaired functioning  Legal Status  No Legal Problems  Medical/Nutritional Concerns  unspecified  Comments: Staysha has a history of health problems documented in her 2019 evaluation.  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  Motor Activity: tic, mannerism  Stream of Thought - Productivity: spontaneous  Stream of thought - Progression: perseveration  Stream of thought - Language: repetitive  Emotional tone and reactions - Mood: labile  Emotional tone and reactions - Affect: WNL Mental trend/Content of thoughts - Perception: normal  Mental trend/Content of thoughts - Orientation: normal  Mental trend/Content of thoughts - Memory: normal  Mental trend/Content of thoughts - General knowledge: concrete  Insight: consistent with developmental delays  Judgment: consistent with developmental delays    Aamilah Augenstein L Lilo Wallington, PhD               Manna Gose L Holliday Sheaffer, PhD

## 2024-04-05 ENCOUNTER — Encounter (HOSPITAL_COMMUNITY): Payer: MEDICAID

## 2024-04-09 ENCOUNTER — Encounter (HOSPITAL_COMMUNITY): Payer: MEDICAID

## 2024-04-12 ENCOUNTER — Encounter (HOSPITAL_COMMUNITY): Payer: MEDICAID

## 2024-04-16 ENCOUNTER — Encounter (HOSPITAL_COMMUNITY): Payer: MEDICAID

## 2024-04-17 ENCOUNTER — Ambulatory Visit: Payer: MEDICAID | Admitting: Clinical

## 2024-04-19 ENCOUNTER — Encounter (HOSPITAL_COMMUNITY): Payer: MEDICAID

## 2024-05-01 ENCOUNTER — Ambulatory Visit: Payer: MEDICAID | Admitting: Clinical

## 2024-05-01 DIAGNOSIS — F419 Anxiety disorder, unspecified: Secondary | ICD-10-CM

## 2024-05-01 DIAGNOSIS — F840 Autistic disorder: Secondary | ICD-10-CM | POA: Diagnosis not present

## 2024-05-01 NOTE — Progress Notes (Signed)
 Time: 8:01 am-8:54 am Diagnosis: F84.0, F41.9 CPT: 09162E-04  Kaitlan was seen remotely using secure video conferencing. She was in her home in [place] , and the therapist was in her office at the time of the appointment. Client is aware of risks of telehealth and consented to a virtual visit. Debborah's mother joined the beginning of session and reported that they had had to cancel her previous session due to changes in the family's schedule. Although James's mother had initially told her it was actually the therapist that had canceled, she had eventually disclosed the truth to her, and Gyselle had been able to cope with this. Session included review and practice of coping strategies, as well as a discussion of coping with change that incorporated video modeling. Joydan is scheduled to be seen again in one month.   Treatment Plan Client Abilities/Strengths  Orchid presents as sociable and upbeat once she is engaged with an activity.  Client Treatment Preferences  Lorice is most engaged during morning appointments.  Client Statement of Needs  Caretha's mother is seeking CBT therapy to help her manage feelings of anxiety, especially related to changes in routine and time, and develop emotion regulation strategies  Treatment Level  Monthly  Symptoms  Anxiety: resistance to transition, feelings of worry, difficulty relaxing, tearfulness, angry outbursts including shouting and refusal to participate in undesired activities (Status: maintained).  Problems Addressed  New Description  Goals 1. Deshawnda's mother is seeking CBT therapy to help her manage anxiety and develop emotion regulation strategies Objective Omelia will be provided opportunities to process her experiences in social interactions and look for opportunities for social connection Target Date: 2026-011-1 Frequency: Biweekly  Progress: 60% Modality: individual  Related Interventions Therapist will provide Nazarene an opportunity to reflect upon  her experiences in social interactions Therapist will engage Mattisyn in review and discussion of social skills Therapist will engage Deb in discussion of communication strategies for when she is feeling stressed or would like to leave a social situation  Objective Ivory will develop strategies to regulate her emotions and will experience an overall decrease in anxiety Target Date: 2024-07-08 Frequency: Monthly  Progress: 70% Modality: individual   Objective Sury will increase her time management skills Target date: 07/08/2024 Progress: 55% Modality: Individual Therapy Frequency: Biweekly  Objective  Princes increase overall self awareness Target: 07/08/2024 Progress: 55% Modality: Individual Therapy Frequency: Biweekly   Related Interventions Therapist will engage Ruqayyah's parents in treatment to help them create a routine and home environment that supports her goals Therapist will help Keiosha to identify and disengage from maladaptive thought patterns using CBT-based strategies Therapist will provide referrals to additional resources as appropriate Therapist will incorporate video modeling to promote social skills as appropriate, including use of the [employer] and other resources Therapist will explore with Tonae how she can connect with others around her interests, as well as learn about the interests of others Annete will develop strategies to help her regulate her emotions, including breathing exercises and self-care Therapist will provide Tymeshia with an opportunity to process her experiences in session Diagnosis Axis none 299.00 (Autistic disorder, current or active state) - Open - [Signifier: n/a]    Axis none 300.00 (Anxiety state, unspecified) - Open - [Signifier: n/a]    Conditions For Discharge Achievement of treatment goals and objectives   Intake Presenting Problem Deloras and her mother were seen remotely using the [employer] system. They are seeking  CBT to help Adelina manage emotion regulation difficulties and  anxiety related to a series of anticipated transitions. Symptoms social impairment, self injury, rigid adherence to schedule and routine, anxiety at unexpected changes History of Problem  Franklin was diagnosed with ASD by the present provider in January of 2020 (please see evaluation on record). Since that time, her family has been able to access a range of services that have been helpful for her, including intensive in-home ABA therapy. However, Gardenia has struggled with the abrupt end to some of her services, resulting in the end of relationships with beloved therapists  Recent Trigger  Sharetha's mother is seeking CBT to help her manage anxiety and emotion regulation struggles related to anticipated transitions in therapy.  Marital and Family Information  Present family concerns/problems: none noted.  Strengths/resources in the family/friends: Vani's mother presented as supportive of her development.  Marital/sexual history patterns: none reported.  Family of Origin  Problems in family of origin: None reported.  Family background / ethnic factors: None reported.  No needs/concerns related to ethnicity reported when asked: No  Education/Vocation  Interpersonal concerns/problems: Illianna was observed to struggle with social interactions, and appeared to function at about a 58 or 18 year-old level. This was consistent with results from an IQ test administered as part of her 2019 evaluation. She continues to struggle with difficulty with transition and rigidity regarding time.  Personal strengths: Breeonna presented as pleasant and sociable Military/work problems/concerns: None noted.  Leisure Activities/Daily Functioning  impaired functioning  Legal Status  No Legal Problems  Medical/Nutritional Concerns  unspecified  Comments: Maytte has a history of health problems documented in her 2019 evaluation.  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  Motor Activity: tic, mannerism  Stream of Thought - Productivity: spontaneous  Stream of thought - Progression: perseveration  Stream of thought - Language: repetitive  Emotional tone and reactions - Mood: labile  Emotional tone and reactions - Affect: juvenile  Mental trend/Content of thoughts - Perception: normal  Mental trend/Content of thoughts - Orientation: normal  Mental trend/Content of thoughts - Memory: normal  Mental trend/Content of thoughts - General knowledge: concrete  Insight: minimal  Judgment: poor  Mental Status Comment:WNL  Diagnostic Summary  F84.0 (autism spectrum disorder), F41.9 Anxiety Disorder NOS   Intake Presenting Problem Quetzalli and her mother were seen remotely using the [employer] system. They are seeking CBT to help Kyliana manage emotion regulation difficulties and anxiety related to a series of anticipated transitions. Symptoms social impairment, self injury, rigid adherence to schedule and routine, anxiety at unexpected changes History of Problem  Bina was diagnosed with ASD by the present provider in January of 2020 (please see evaluation on record). Since that time, her family has been able to access a range of services that have been helpful for her, including intensive in-home ABA therapy. Recent Trigger  Kitti's mother is seeking CBT to help her manage anxiety and emotion regulation struggles related to anticipated transitions in therapy.  Marital and Family Information  Present family concerns/problems: none noted.  Strengths/resources in the family/friends: Cleopatra's mother presented as supportive of her development.  Marital/sexual history patterns: none reported.  Family of Origin  Problems in family of origin: None  reported.  Family background / ethnic factors: None reported.  No needs/concerns related to ethnicity reported when asked: No  Education/Vocation  Interpersonal concerns/problems: Neelah continues to struggle with difficulty with transition and rigidity regarding time. She also struggles with reciprocal conversation. Personal strengths: Joycie is typically pleasant and motivated in therapy.  Military/work problems/concerns: None noted.  Leisure Activities/Daily Functioning  impaired functioning  Legal Status  No Legal Problems  Medical/Nutritional Concerns  unspecified  Comments: Lasonya has a history of health problems documented in her 2019 evaluation.  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  Motor Activity: tic, mannerism  Stream of Thought - Productivity: spontaneous  Stream of thought - Progression: perseveration  Stream of thought - Language: repetitive  Emotional tone and reactions - Mood: labile  Emotional tone and reactions - Affect: WNL Mental trend/Content of thoughts - Perception: normal  Mental trend/Content of thoughts - Orientation: normal  Mental trend/Content of thoughts - Memory: normal  Mental trend/Content of thoughts - General knowledge: concrete  Insight: consistent with developmental delays  Judgment: consistent with developmental delays   Matayah Reyburn L Vinton Layson, PhD               Asjia Berrios L Dutch Ing, PhD

## 2024-05-08 ENCOUNTER — Institutional Professional Consult (permissible substitution) (INDEPENDENT_AMBULATORY_CARE_PROVIDER_SITE_OTHER): Payer: Self-pay

## 2024-05-15 ENCOUNTER — Ambulatory Visit: Payer: MEDICAID | Admitting: Clinical

## 2024-05-29 ENCOUNTER — Ambulatory Visit: Payer: MEDICAID | Admitting: Clinical

## 2024-05-29 DIAGNOSIS — F419 Anxiety disorder, unspecified: Secondary | ICD-10-CM

## 2024-05-29 DIAGNOSIS — F840 Autistic disorder: Secondary | ICD-10-CM

## 2024-05-29 NOTE — Progress Notes (Signed)
 Time: 8:01 am-8:54 am Diagnosis: F84.0, F41.9 CPT: 09162E-04  Ramonica was seen remotely using secure video conferencing. She was in her home in [place] , and the therapist was in her office at the time of the appointment. Client is aware of risks of telehealth and consented to a virtual visit. Therapist began session by letting Solash and her mother know that they would need to update the treatment plan in the next session. Session included creation of an agenda, practicing coping strategies, and discussion of social skills. Veeda is scheduled to be seen again in one month.   Treatment Plan Client Abilities/Strengths  Rosangelica presents as sociable and upbeat once she is engaged with an activity.  Client Treatment Preferences  Ardath is most engaged during morning appointments.  Client Statement of Needs  Hara's mother is seeking CBT therapy to help her manage feelings of anxiety, especially related to changes in routine and time, and develop emotion regulation strategies  Treatment Level  Monthly  Symptoms  Anxiety: resistance to transition, feelings of worry, difficulty relaxing, tearfulness, angry outbursts including shouting and refusal to participate in undesired activities (Status: maintained).  Problems Addressed  New Description  Goals 1. Aerith's mother is seeking CBT therapy to help her manage anxiety and develop emotion regulation strategies Objective Lillyn will be provided opportunities to process her experiences in social interactions and look for opportunities for social connection Target Date: 2026-011-1 Frequency: Biweekly  Progress: 60% Modality: individual  Related Interventions Therapist will provide Yoshie an opportunity to reflect upon her experiences in social interactions Therapist will engage Lucylle in review and discussion of social skills Therapist will engage Sausha in discussion of communication strategies for when she is feeling stressed or would like to leave a  social situation  Objective Wafa will develop strategies to regulate her emotions and will experience an overall decrease in anxiety Target Date: 2024-07-08 Frequency: Monthly  Progress: 70% Modality: individual   Objective Maddyx will increase her time management skills Target date: 07/08/2024 Progress: 55% Modality: Individual Therapy Frequency: Biweekly  Objective  Zafiro increase overall self awareness Target: 07/08/2024 Progress: 55% Modality: Individual Therapy Frequency: Biweekly   Related Interventions Therapist will engage Laporshia's parents in treatment to help them create a routine and home environment that supports her goals Therapist will help Saniyah to identify and disengage from maladaptive thought patterns using CBT-based strategies Therapist will provide referrals to additional resources as appropriate Therapist will incorporate video modeling to promote social skills as appropriate, including use of the [employer] and other resources Therapist will explore with Gioia how she can connect with others around her interests, as well as learn about the interests of others Elinor will develop strategies to help her regulate her emotions, including breathing exercises and self-care Therapist will provide Chantell with an opportunity to process her experiences in session Diagnosis Axis none 299.00 (Autistic disorder, current or active state) - Open - [Signifier: n/a]    Axis none 300.00 (Anxiety state, unspecified) - Open - [Signifier: n/a]    Conditions For Discharge Achievement of treatment goals and objectives   Intake Presenting Problem Kauri and her mother were seen remotely using the [employer] system. They are seeking CBT to help Jewel manage emotion regulation difficulties and anxiety related to a series of anticipated transitions. Symptoms social impairment, self injury, rigid adherence to schedule and routine, anxiety at unexpected changes History  of Problem  Kiara was diagnosed with ASD by the present provider in January of 2020 (please  see evaluation on record). Since that time, her family has been able to access a range of services that have been helpful for her, including intensive in-home ABA therapy. However, Renleigh has struggled with the abrupt end to some of her services, resulting in the end of relationships with beloved therapists  Recent Trigger  Carmaleta's mother is seeking CBT to help her manage anxiety and emotion regulation struggles related to anticipated transitions in therapy.  Marital and Family Information  Present family concerns/problems: none noted.  Strengths/resources in the family/friends: Kayal's mother presented as supportive of her development.  Marital/sexual history patterns: none reported.  Family of Origin  Problems in family of origin: None reported.  Family background / ethnic factors: None reported.  No needs/concerns related to ethnicity reported when asked: No  Education/Vocation  Interpersonal concerns/problems: Ardythe was observed to struggle with social interactions, and appeared to function at about a 8 or 18 year-old level. This was consistent with results from an IQ test administered as part of her 2019 evaluation. She continues to struggle with difficulty with transition and rigidity regarding time.  Personal strengths: Amorah presented as pleasant and sociable Military/work problems/concerns: None noted.  Leisure Activities/Daily Functioning  impaired functioning  Legal Status  No Legal Problems  Medical/Nutritional Concerns  unspecified  Comments: Nyellie has a history of health problems documented in her 2019 evaluation.  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  Motor Activity:  tic, mannerism  Stream of Thought - Productivity: spontaneous  Stream of thought - Progression: perseveration  Stream of thought - Language: repetitive  Emotional tone and reactions - Mood: labile  Emotional tone and reactions - Affect: juvenile  Mental trend/Content of thoughts - Perception: normal  Mental trend/Content of thoughts - Orientation: normal  Mental trend/Content of thoughts - Memory: normal  Mental trend/Content of thoughts - General knowledge: concrete  Insight: minimal  Judgment: poor  Mental Status Comment:WNL  Diagnostic Summary  F84.0 (autism spectrum disorder), F41.9 Anxiety Disorder NOS   Intake Presenting Problem Amirrah and her mother were seen remotely using the [employer] system. They are seeking CBT to help Artis manage emotion regulation difficulties and anxiety related to a series of anticipated transitions. Symptoms social impairment, self injury, rigid adherence to schedule and routine, anxiety at unexpected changes History of Problem  Jenness was diagnosed with ASD by the present provider in January of 2020 (please see evaluation on record). Since that time, her family has been able to access a range of services that have been helpful for her, including intensive in-home ABA therapy. Recent Trigger  Marlena's mother is seeking CBT to help her manage anxiety and emotion regulation struggles related to anticipated transitions in therapy.  Marital and Family Information  Present family concerns/problems: none noted.  Strengths/resources in the family/friends: Gilbert's mother presented as supportive of her development.  Marital/sexual history patterns: none reported.  Family of Origin  Problems in family of origin: None reported.  Family background / ethnic factors: None reported.  No needs/concerns related to ethnicity reported when asked: No  Education/Vocation  Interpersonal concerns/problems: Sumedha continues to struggle with difficulty with transition and  rigidity regarding time. She also struggles with reciprocal conversation. Personal strengths: Shakeria is typically pleasant and motivated in therapy.  Military/work problems/concerns: None noted.  Leisure Activities/Daily Functioning  impaired functioning  Legal Status  No Legal Problems  Medical/Nutritional Concerns  unspecified  Comments: Kilah has a history of health problems documented in her 2019 evaluation.  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  Motor Activity: tic, mannerism  Stream of Thought - Productivity: spontaneous  Stream of thought - Progression: perseveration  Stream of thought - Language: repetitive  Emotional tone and reactions - Mood: labile  Emotional tone and reactions - Affect: WNL Mental trend/Content of thoughts - Perception: normal  Mental trend/Content of thoughts - Orientation: normal  Mental trend/Content of thoughts - Memory: normal  Mental trend/Content of thoughts - General knowledge: concrete  Insight: consistent with developmental delays  Judgment: consistent with developmental delays   Shauntia Levengood L Krystiana Fornes, PhD               Konrad Hoak L Caliope Ruppert, PhD               Lilee Aldea L Denis Koppel, PhD

## 2024-06-26 ENCOUNTER — Ambulatory Visit: Payer: MEDICAID | Admitting: Clinical

## 2024-06-26 DIAGNOSIS — F840 Autistic disorder: Secondary | ICD-10-CM | POA: Diagnosis not present

## 2024-06-26 DIAGNOSIS — F419 Anxiety disorder, unspecified: Secondary | ICD-10-CM | POA: Diagnosis not present

## 2024-06-26 NOTE — Progress Notes (Signed)
 Time: 8:01 am-8:54 am Diagnosis: F84.0, F41.9 CPT: 09162E-04  Janaiyah was seen remotely using secure video conferencing. She was in her home in [place] , and the therapist was in her office at the time of the appointment. Client is aware of risks of telehealth and consented to a virtual visit. Therapist let Paisly and her mother know that it is time to update her treatment plan, and her mother requested to do so in a separate session scheduled specifically for that purpose. An appointment was scheduled for 1/27. Session included review of stressful events from over the holidays, as well as video modeling of emotion regulation strategies, with therapist engaged Hedda in consideration of which strategies had been helpful for her over the break. Dayrin is scheduled to be seen again in two weeks.   Treatment Plan Client Abilities/Strengths  Dinah presents as sociable and upbeat once she is engaged with an activity.  Client Treatment Preferences  Sugar is most engaged during morning appointments.  Client Statement of Needs  Iqra's mother is seeking CBT therapy to help her manage feelings of anxiety, especially related to changes in routine and time, and develop emotion regulation strategies  Treatment Level  Monthly  Symptoms  Anxiety: resistance to transition, feelings of worry, difficulty relaxing, tearfulness, angry outbursts including shouting and refusal to participate in undesired activities (Status: maintained).  Problems Addressed  New Description  Goals 1. Yassmine's mother is seeking CBT therapy to help her manage anxiety and develop emotion regulation strategies Objective Jermisha will be provided opportunities to process her experiences in social interactions and look for opportunities for social connection Target Date: 2026-011-1 Frequency: Biweekly  Progress: 60% Modality: individual  Related Interventions Therapist will provide Maeven an opportunity to reflect upon her experiences in  social interactions Therapist will engage Nasiya in review and discussion of social skills Therapist will engage Teena in discussion of communication strategies for when she is feeling stressed or would like to leave a social situation  Objective Peg will develop strategies to regulate her emotions and will experience an overall decrease in anxiety Target Date: 2024-07-08 Frequency: Monthly  Progress: 70% Modality: individual   Objective Trudy will increase her time management skills Target date: 07/08/2024 Progress: 55% Modality: Individual Therapy Frequency: Biweekly  Objective  Lindzie increase overall self awareness Target: 07/08/2024 Progress: 55% Modality: Individual Therapy Frequency: Biweekly   Related Interventions Therapist will engage Ebany's parents in treatment to help them create a routine and home environment that supports her goals Therapist will help Annamae to identify and disengage from maladaptive thought patterns using CBT-based strategies Therapist will provide referrals to additional resources as appropriate Therapist will incorporate video modeling to promote social skills as appropriate, including use of the [employer] and other resources Therapist will explore with Tesneem how she can connect with others around her interests, as well as learn about the interests of others Artemisa will develop strategies to help her regulate her emotions, including breathing exercises and self-care Therapist will provide Taygen with an opportunity to process her experiences in session Diagnosis Axis none 299.00 (Autistic disorder, current or active state) - Open - [Signifier: n/a]    Axis none 300.00 (Anxiety state, unspecified) - Open - [Signifier: n/a]    Conditions For Discharge Achievement of treatment goals and objectives   Intake Presenting Problem Arihana and her mother were seen remotely using the [employer] system. They are seeking CBT to help [place]  manage emotion regulation difficulties and anxiety related to a  series of anticipated transitions. Symptoms social impairment, self injury, rigid adherence to schedule and routine, anxiety at unexpected changes History of Problem  Surya was diagnosed with ASD by the present provider in January of 2020 (please see evaluation on record). Since that time, her family has been able to access a range of services that have been helpful for her, including intensive in-home ABA therapy. However, Rayelle has struggled with the abrupt end to some of her services, resulting in the end of relationships with beloved therapists  Recent Trigger  Dolorez's mother is seeking CBT to help her manage anxiety and emotion regulation struggles related to anticipated transitions in therapy.  Marital and Family Information  Present family concerns/problems: none noted.  Strengths/resources in the family/friends: Anaiyah's mother presented as supportive of her development.  Marital/sexual history patterns: none reported.  Family of Origin  Problems in family of origin: None reported.  Family background / ethnic factors: None reported.  No needs/concerns related to ethnicity reported when asked: No  Education/Vocation  Interpersonal concerns/problems: Kajsa was observed to struggle with social interactions, and appeared to function at about a 31 or 19 year-old level. This was consistent with results from an IQ test administered as part of her 2019 evaluation. She continues to struggle with difficulty with transition and rigidity regarding time.  Personal strengths: Kitrina presented as pleasant and sociable Military/work problems/concerns: None noted.  Leisure Activities/Daily Functioning  impaired functioning  Legal Status  No Legal Problems  Medical/Nutritional Concerns  unspecified  Comments: Ghalia has a history of health problems documented in her 2019 evaluation.  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  Motor Activity: tic, mannerism  Stream of Thought - Productivity: spontaneous  Stream of thought - Progression: perseveration  Stream of thought - Language: repetitive  Emotional tone and reactions - Mood: labile  Emotional tone and reactions - Affect: juvenile  Mental trend/Content of thoughts - Perception: normal  Mental trend/Content of thoughts - Orientation: normal  Mental trend/Content of thoughts - Memory: normal  Mental trend/Content of thoughts - General knowledge: concrete  Insight: minimal  Judgment: poor  Mental Status Comment:WNL  Diagnostic Summary  F84.0 (autism spectrum disorder), F41.9 Anxiety Disorder NOS   Intake Presenting Problem Arrietty and her mother were seen remotely using the [employer] system. They are seeking CBT to help Cohen manage emotion regulation difficulties and anxiety related to a series of anticipated transitions. Symptoms social impairment, self injury, rigid adherence to schedule and routine, anxiety at unexpected changes History of Problem  Michael was diagnosed with ASD by the present provider in January of 2020 (please see evaluation on record). Since that time, her family has been able to access a range of services that have been helpful for her, including intensive in-home ABA therapy. Recent Trigger  Johnnye's mother is seeking CBT to help her manage anxiety and emotion regulation struggles related to anticipated transitions in therapy.  Marital and Family Information  Present family concerns/problems: none noted.  Strengths/resources in the family/friends: Tiffanyann's mother presented as supportive of her development.  Marital/sexual history patterns: none reported.  Family of Origin  Problems in family of origin: None reported.  Family  background / ethnic factors: None reported.  No needs/concerns related to ethnicity reported when asked: No  Education/Vocation  Interpersonal concerns/problems:  Keilee continues to struggle with difficulty with transition and rigidity regarding time. She also struggles with reciprocal conversation. Personal strengths: Chauncey is typically pleasant and motivated in therapy.  Military/work problems/concerns: None noted.  Leisure Activities/Daily Functioning  impaired functioning  Legal Status  No Legal Problems  Medical/Nutritional Concerns  unspecified  Comments: Nikeia has a history of health problems documented in her 2019 evaluation.  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  Motor Activity: tic, mannerism  Stream of Thought - Productivity: spontaneous  Stream of thought - Progression: perseveration  Stream of thought - Language: repetitive  Emotional tone and reactions - Mood: labile  Emotional tone and reactions - Affect: WNL Mental trend/Content of thoughts - Perception: normal  Mental trend/Content of thoughts - Orientation: normal  Mental trend/Content of thoughts - Memory: normal  Mental trend/Content of thoughts - General knowledge: concrete  Insight: consistent with developmental delays  Judgment: consistent with developmental delays     Kris No L Kamylah Manzo, PhD

## 2024-06-28 ENCOUNTER — Institutional Professional Consult (permissible substitution) (INDEPENDENT_AMBULATORY_CARE_PROVIDER_SITE_OTHER): Payer: Self-pay

## 2024-07-10 ENCOUNTER — Ambulatory Visit: Payer: MEDICAID | Admitting: Clinical

## 2024-07-10 DIAGNOSIS — F419 Anxiety disorder, unspecified: Secondary | ICD-10-CM | POA: Diagnosis not present

## 2024-07-10 DIAGNOSIS — F840 Autistic disorder: Secondary | ICD-10-CM | POA: Diagnosis not present

## 2024-07-10 NOTE — Progress Notes (Signed)
 Time: 8:01 am-8:54 am Diagnosis: F84.0, F41.9 CPT: 09162E-04  Cristina Long was seen remotely using secure video conferencing. She was in her home in [place] , and the therapist was in her office at the time of the appointment. Client is aware of risks of telehealth and consented to a virtual visit. Following creation of an agenda, Therapist let Cristina Long know of plans to meet with her mother the following week to update her treatment plan. Cristina Long expressed hesitation at joining the appointment, so therapist reviewed Cristina Long's treatment plan with her and incorporated her input. Session then included practicing two relaxation strategies (5-4-3-2-1 and equal breathing), followed by reviewing conversational skills. Therapist is scheduled to meet with Cristina Long and her mother to finalize updates to her treatment plan in one week.   Treatment Plan Client Abilities/Strengths  Cristina Long presents as sociable and upbeat once she is engaged with an activity.  Client Treatment Preferences  Cristina Long is most engaged during morning appointments.  Client Statement of Needs  Cristina Long's mother is seeking CBT therapy to help her manage feelings of anxiety, especially related to changes in routine and time, and develop emotion regulation strategies  Treatment Level  Monthly  Symptoms  Anxiety: resistance to transition, feelings of worry, difficulty relaxing, tearfulness, angry outbursts including shouting and refusal to participate in undesired activities (Status: maintained).  Problems Addressed  New Description  Goals 1. Cristina Long's mother is seeking CBT therapy to help her manage anxiety and develop emotion regulation strategies Objective Cristina Long will be provided opportunities to process her experiences in social interactions and look for opportunities for social connection Target Date: 2026-011-1 Frequency: Biweekly  Progress: 60% Modality: individual  Related Interventions Therapist will provide Cristina Long an opportunity to reflect upon  her experiences in social interactions Therapist will engage Cristina Long in review and discussion of social skills Therapist will engage Cristina Long in discussion of communication strategies for when she is feeling stressed or would like to leave a social situation  Objective Cristina Long will develop strategies to regulate her emotions and will experience an overall decrease in anxiety Target Date: 2024-07-08 Frequency: Monthly  Progress: 70% Modality: individual   Objective Cristina Long will increase her time management skills Target date: 07/08/2024 Progress: 55% Modality: Individual Therapy Frequency: Biweekly  Objective  Cristina Long increase overall self awareness Target: 07/08/2024 Progress: 55% Modality: Individual Therapy Frequency: Biweekly   Related Interventions Therapist will engage Cristina Long's parents in treatment to help them create a routine and home environment that supports her goals Therapist will help Cristina Long to identify and disengage from maladaptive thought patterns using CBT-based strategies Therapist will provide referrals to additional resources as appropriate Therapist will incorporate video modeling to promote social skills as appropriate, including use of the [employer] and other resources Therapist will explore with Cristina Long how she can connect with others around her interests, as well as learn about the interests of others Cristina Long will develop strategies to help her regulate her emotions, including breathing exercises and self-care Therapist will work with Cristina Long to help her form realistic plans with awareness of time and logistical restrictions Therapist will provide Cristina Long with an opportunity to process her experiences in session Diagnosis Axis none 299.00 (Autistic disorder, current or active state) - Open - [Signifier: n/a]    Axis none 300.00 (Anxiety state, unspecified) - Open - [Signifier: n/a]    Conditions For Discharge Achievement of treatment goals and  objectives   Intake Presenting Problem See and her mother were seen remotely using the [employer] system. They are seeking  CBT to help Cristina Long manage emotion regulation difficulties and anxiety related to a series of anticipated transitions. Symptoms social impairment, self injury, rigid adherence to schedule and routine, anxiety at unexpected changes History of Problem  Cristina Long was diagnosed with ASD by the present provider in January of 2020 (please see evaluation on record). Since that time, her family has been able to access a range of services that have been helpful for her, including intensive in-home ABA therapy. However, Cristina Long has struggled with the abrupt end to some of her services, resulting in the end of relationships with beloved therapists  Recent Trigger  Cristina Long's mother is seeking CBT to help her manage anxiety and emotion regulation struggles related to anticipated transitions in therapy.  Marital and Family Information  Present family concerns/problems: none noted.  Strengths/resources in the family/friends: Cristina Long's mother presented as supportive of her development.  Marital/sexual history patterns: none reported.  Family of Origin  Problems in family of origin: None reported.  Family background / ethnic factors: None reported.  No needs/concerns related to ethnicity reported when asked: No  Education/Vocation  Interpersonal concerns/problems: Cristina Long was observed to struggle with social interactions, and appeared to function at about a 79 or 19 year-old level. This was consistent with results from an IQ test administered as part of her 2019 evaluation. She continues to struggle with difficulty with transition and rigidity regarding time.  Personal strengths: Cristina Long presented as pleasant and sociable Military/work problems/concerns: None noted.  Leisure Activities/Daily Functioning  impaired functioning  Legal Status  No Legal Problems  Medical/Nutritional Concerns   unspecified  Comments: Cristina Long has a history of health problems documented in her 2019 evaluation.  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  Motor Activity: tic, mannerism  Stream of Thought - Productivity: spontaneous  Stream of thought - Progression: perseveration  Stream of thought - Language: repetitive  Emotional tone and reactions - Mood: labile  Emotional tone and reactions - Affect: juvenile  Mental trend/Content of thoughts - Perception: normal  Mental trend/Content of thoughts - Orientation: normal  Mental trend/Content of thoughts - Memory: normal  Mental trend/Content of thoughts - General knowledge: concrete  Insight: minimal  Judgment: poor  Mental Status Comment:WNL  Diagnostic Summary  F84.0 (autism spectrum disorder), F41.9 Anxiety Disorder NOS   Intake Presenting Problem Cristina Long and her mother were seen remotely using the [employer] system. They are seeking CBT to help Cristina Long manage emotion regulation difficulties and anxiety related to a series of anticipated transitions. Symptoms social impairment, self injury, rigid adherence to schedule and routine, anxiety at unexpected changes History of Problem  Cristina Long was diagnosed with ASD by the present provider in January of 2020 (please see evaluation on record). Since that time, her family has been able to access a range of services that have been helpful for her, including intensive in-home ABA therapy. Recent Trigger  Cristina Long's mother is seeking CBT to help her manage anxiety and emotion regulation struggles related to anticipated transitions in therapy.  Marital and Family Information  Present family concerns/problems: none noted.  Strengths/resources in the family/friends: Cristina Long's mother presented as  supportive of her development.  Marital/sexual history patterns: none reported.  Family of Origin  Problems in family of origin: None reported.  Family background / ethnic factors: None reported.  No  needs/concerns related to ethnicity reported when asked: No  Education/Vocation  Interpersonal concerns/problems: Cristina Long continues to struggle with difficulty with transition and rigidity regarding time. She also struggles with reciprocal conversation. Personal strengths: Cristina Long is typically pleasant and motivated in therapy.  Military/work problems/concerns: None noted.  Leisure Activities/Daily Functioning  impaired functioning  Legal Status  No Legal Problems  Medical/Nutritional Concerns  unspecified  Comments: Ronette has a history of health problems documented in her 2019 evaluation.  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  Motor Activity: tic, mannerism  Stream of Thought - Productivity: spontaneous  Stream of thought - Progression: perseveration  Stream of thought - Language: repetitive  Emotional tone and reactions - Mood: labile  Emotional tone and reactions - Affect: WNL Mental trend/Content of thoughts - Perception: normal  Mental trend/Content of thoughts - Orientation: normal  Mental trend/Content of thoughts - Memory: normal  Mental trend/Content of thoughts - General knowledge: concrete  Insight: consistent with developmental delays  Judgment: consistent with developmental delays     Nasean Zapf L Elyssia Strausser, PhD

## 2024-07-16 ENCOUNTER — Ambulatory Visit: Payer: MEDICAID | Admitting: Clinical

## 2024-07-18 ENCOUNTER — Ambulatory Visit: Payer: MEDICAID | Admitting: Clinical

## 2024-07-18 DIAGNOSIS — F840 Autistic disorder: Secondary | ICD-10-CM

## 2024-07-18 DIAGNOSIS — F419 Anxiety disorder, unspecified: Secondary | ICD-10-CM | POA: Diagnosis not present

## 2024-07-18 NOTE — Progress Notes (Signed)
 Time: 1:01 pm-1:48 pm Diagnosis: F84.0, F41.9 CPT: [id number]-04  Tamila and her mother were seen remotely using secure video conferencing. She was in her home in [place] , and the therapist was in her office at the time of the appointment. Client is aware of risks of telehealth and consented to a virtual visit. Kamy briefly joined the beginning of session to greet the therapist but then left, having reviewed the treatment plan with the therapist the prior week. Therapist reviewed and updated the treatment plan with Cailah's mother. Her mother provided input into and verbally consented to all goals and interventions. Myrel is scheduled to be seen again in one week.  Treatment Plan Client Abilities/Strengths  Yarah presents as sociable and upbeat once she is engaged with an activity.  Client Treatment Preferences  Verleen is most engaged during morning appointments.  Client Statement of Needs  Evangelyne's mother is seeking CBT therapy to help her manage feelings of anxiety, especially related to changes in routine and time, and develop emotion regulation strategies  Treatment Level  Monthly  Symptoms  Anxiety: resistance to transition, feelings of worry, difficulty relaxing, tearfulness, angry outbursts including shouting and refusal to participate in undesired activities (Status: maintained).  Problems Addressed  New Description  Goals 1. Rickeya's mother is seeking CBT therapy to help her manage anxiety and develop emotion regulation strategies Objective Katrice will be provided opportunities to process her experiences in social interactions and look for opportunities for social connection Target Date: 07/18/2025 Frequency: Biweekly  Progress: 65% Modality: individual  Related Interventions Therapist will provide Gerda an opportunity to reflect upon her experiences in social interactions Therapist will engage Chaniqua in review and discussion of social skills Therapist will engage Enslie in  discussion of communication strategies for when she is feeling stressed or would like to leave a social situation  Objective Linnaea will develop strategies to regulate her emotions and will experience an overall decrease in anxiety Target Date: 07/18/2025 Frequency: Biweekly  Progress: 60% Modality: individual   Objective Rodina will increase her time management skills Target date: 07/18/2025 Progress: 60% Modality: Individual Therapy Frequency: Biweekly  Objective  Makaelyn increase overall self awareness Target: 07/18/2025 Progress: 65% Modality: Individual Therapy Frequency: Biweekly  Related Interventions Therapist will engage Sandar's parents in treatment to help them create a routine and home environment that supports her goals Therapist will help Aretha to identify and disengage from maladaptive thought and behavior patterns using CBT-based strategies Therapist will provide referrals to additional resources as appropriate Therapist will incorporate video modeling to promote social skills as appropriate, including use of the [employer] and other resources Therapist will explore with Angellee how she can connect with others around her interests, as well as learn about the interests of others Nikiya will develop strategies to help her regulate her emotions, including breathing exercises and self-care Therapist will work with [name] to help her form realistic plans with awareness of time and logistical restrictions Therapist will provide Masen with an opportunity to process her experiences in session  Diagnosis Axis none 299.00 (Autistic disorder, current or active state) - Open - [Signifier: n/a]    Axis none 300.00 (Anxiety state, unspecified) - Open - [Signifier: n/a]    Conditions For Discharge Achievement of treatment goals and objectives   Intake Presenting Problem Shavawn and her mother are seeking CBT to help Francoise manage emotion regulation difficulties and  anxiety. Symptoms social impairment, self injury, rigid adherence to schedule and routine, anxiety at unexpected changes History  of Problem  Nida was diagnosed with ASD by the present provider in January of 2020 (please see evaluation on record). Since that time, her family has been able to access a range of services that have been helpful for her, including intensive in-home ABA therapy. Braylynn and her parents pursued individual therapy to address anxiety and emotion regulation challenges, especially with regard to changes in schedule and unexpected changes in plans. Recent Trigger  Anuradha's mother initially sought CBT therapy for her to help her adjust to a series of anticipated transitions. Marital and Family Information  Present family concerns/problems: none noted.  Strengths/resources in the family/friends: Chaylee's mother presented as supportive of her development.  Marital/sexual history patterns: none reported.  Family of Origin  Problems in family of origin: None reported.  Family background / ethnic factors: None reported.  No needs/concerns related to ethnicity reported when asked: No  Education/Vocation  Interpersonal concerns/problems: Larrissa was observed to struggle with social interactions, and appeared to function at about a 15 or 20 year-old level. This was consistent with results from an IQ test administered as part of her 2019 evaluation. She continues to struggle with difficulty with transition and rigidity regarding time.  Personal strengths: Rori presented as pleasant and sociable Military/work problems/concerns: None noted.  Leisure Activities/Daily Functioning  impaired functioning  Legal Status  No Legal Problems  Medical/Nutritional Concerns  unspecified  Comments: Radonna has a history of health problems documented in her 2019 evaluation.  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  Motor Activity: tic, mannerism  Stream of Thought - Productivity: spontaneous  Stream of thought - Progression: perseveration  Stream of thought - Language: repetitive  Emotional tone and reactions - Mood: labile  Emotional tone and reactions - Affect: juvenile  Mental trend/Content of thoughts - Perception: normal  Mental trend/Content of thoughts - Orientation: normal  Mental trend/Content of thoughts - Memory: normal  Mental trend/Content of thoughts - General knowledge: concrete  Insight: minimal  Judgment: consistent with developmental delays and intellectual disability Mental Status Comment:WNL  Diagnostic Summary  F84.0 (autism spectrum disorder), F41.9 Anxiety Disorder NOS    Andriette LITTIE Ponto, PhD               Andriette LITTIE Ponto, PhD

## 2024-07-24 ENCOUNTER — Ambulatory Visit: Payer: MEDICAID | Admitting: Clinical

## 2024-07-24 DIAGNOSIS — F419 Anxiety disorder, unspecified: Secondary | ICD-10-CM

## 2024-07-24 DIAGNOSIS — F840 Autistic disorder: Secondary | ICD-10-CM | POA: Diagnosis not present

## 2024-07-24 NOTE — Progress Notes (Signed)
 Time: 8:01 am-8:55 am Diagnosis: F84.0, F41.9 CPT: [phone]  Nyellie and her mother were seen remotely using secure video conferencing. She was in her home in [place] , and the therapist was in her office at the time of the appointment. Client is aware of risks of telehealth and consented to a virtual visit. Ellieanna reported upon a good week during which she had to be flexile in several ways. Following creation of an agenda, therapist engaged her in a belly breathing exercise, as well as discussion of the social skills of checking in with herself and using facial expressions to read others' emotions. Yitzel is scheduled to be seen again in two weeks.  Treatment Plan Client Abilities/Strengths  Xareni presents as sociable and upbeat once she is engaged with an activity.  Client Treatment Preferences  Shareeka is most engaged during morning appointments.  Client Statement of Needs  Alayzia's mother is seeking CBT therapy to help her manage feelings of anxiety, especially related to changes in routine and time, and develop emotion regulation strategies  Treatment Level  Monthly  Symptoms  Anxiety: resistance to transition, feelings of worry, difficulty relaxing, tearfulness, angry outbursts including shouting and refusal to participate in undesired activities (Status: maintained).  Problems Addressed  New Description  Goals 1. Marissah's mother is seeking CBT therapy to help her manage anxiety and develop emotion regulation strategies Objective Elfie will be provided opportunities to process her experiences in social interactions and look for opportunities for social connection Target Date: 07/18/2025 Frequency: Biweekly  Progress: 65% Modality: individual  Related Interventions Therapist will provide Luetta an opportunity to reflect upon her experiences in social interactions Therapist will engage Keiri in review and discussion of social skills Therapist will engage Shalom in discussion of  communication strategies for when she is feeling stressed or would like to leave a social situation  Objective Ottilia will develop strategies to regulate her emotions and will experience an overall decrease in anxiety Target Date: 07/18/2025 Frequency: Biweekly  Progress: 60% Modality: individual   Objective Romana will increase her time management skills Target date: 07/18/2025 Progress: 60% Modality: Individual Therapy Frequency: Biweekly  Objective  Sawsan increase overall self awareness Target: 07/18/2025 Progress: 65% Modality: Individual Therapy Frequency: Biweekly  Related Interventions Therapist will engage Heena's parents in treatment to help them create a routine and home environment that supports her goals Therapist will help Nayana to identify and disengage from maladaptive thought and behavior patterns using CBT-based strategies Therapist will provide referrals to additional resources as appropriate Therapist will incorporate video modeling to promote social skills as appropriate, including use of the [employer] and other resources Therapist will explore with Cyndia how she can connect with others around her interests, as well as learn about the interests of others Pola will develop strategies to help her regulate her emotions, including breathing exercises and self-care Therapist will work with [name] to help her form realistic plans with awareness of time and logistical restrictions Therapist will provide Devlin with an opportunity to process her experiences in session  Diagnosis Axis none 299.00 (Autistic disorder, current or active state) - Open - [Signifier: n/a]    Axis none 300.00 (Anxiety state, unspecified) - Open - [Signifier: n/a]    Conditions For Discharge Achievement of treatment goals and objectives   Intake Presenting Problem Michaeleen and her mother are seeking CBT to help Catherin manage emotion regulation difficulties and  anxiety. Symptoms social impairment, self injury, rigid adherence to schedule and routine, anxiety at unexpected  changes History of Problem  Neeley was diagnosed with ASD by the present provider in January of 2020 (please see evaluation on record). Since that time, her family has been able to access a range of services that have been helpful for her, including intensive in-home ABA therapy. Amity and her parents pursued individual therapy to address anxiety and emotion regulation challenges, especially with regard to changes in schedule and unexpected changes in plans. Recent Trigger  Amelie's mother initially sought CBT therapy for her to help her adjust to a series of anticipated transitions. Marital and Family Information  Present family concerns/problems: none noted.  Strengths/resources in the family/friends: Daksha's mother presented as supportive of her development.  Marital/sexual history patterns: none reported.  Family of Origin  Problems in family of origin: None reported.  Family background / ethnic factors: None reported.  No needs/concerns related to ethnicity reported when asked: No  Education/Vocation  Interpersonal concerns/problems: Annalei was observed to struggle with social interactions, and appeared to function at about a 6 or 20 year-old level. This was consistent with results from an IQ test administered as part of her 2019 evaluation. She continues to struggle with difficulty with transition and rigidity regarding time.  Personal strengths: Latresha presented as pleasant and sociable Military/work problems/concerns: None noted.  Leisure Activities/Daily Functioning  impaired functioning  Legal Status  No Legal Problems  Medical/Nutritional Concerns  unspecified  Comments: Mykaela has a history of health problems documented in her 2019 evaluation.  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  Motor Activity: tic, mannerism  Stream of Thought - Productivity: spontaneous  Stream of thought - Progression: perseveration  Stream of thought - Language: repetitive  Emotional tone and reactions - Mood: labile  Emotional tone and reactions - Affect: juvenile  Mental trend/Content of thoughts - Perception: normal  Mental trend/Content of thoughts - Orientation: normal  Mental trend/Content of thoughts - Memory: normal  Mental trend/Content of thoughts - General knowledge: concrete  Insight: minimal  Judgment: consistent with developmental delays and intellectual disability Mental Status Comment:WNL  Diagnostic Summary  F84.0 (autism spectrum disorder), F41.9 Anxiety Disorder NOS     Andriette LITTIE Ponto, PhD               Andriette LITTIE Ponto, PhD

## 2024-08-07 ENCOUNTER — Ambulatory Visit: Payer: MEDICAID | Admitting: Clinical

## 2024-08-21 ENCOUNTER — Ambulatory Visit: Payer: MEDICAID | Admitting: Clinical

## 2024-09-04 ENCOUNTER — Ambulatory Visit: Payer: MEDICAID | Admitting: Clinical
